# Patient Record
Sex: Female | Born: 1975 | Race: White | Hispanic: No | Smoking: Former smoker
Health system: Southern US, Community
[De-identification: ages and names within clinical notes are randomized; demographics above are authoritative.]

## PROBLEM LIST (undated history)

## (undated) DIAGNOSIS — J45909 Unspecified asthma, uncomplicated: Secondary | ICD-10-CM

## (undated) DIAGNOSIS — G43909 Migraine, unspecified, not intractable, without status migrainosus: Secondary | ICD-10-CM

## (undated) HISTORY — PX: WISDOM TOOTH EXTRACTION: SHX21

## (undated) HISTORY — PX: ARTHROSCOPY WITH ANTERIOR CRUCIATE LIGAMENT (ACL) REPAIR WITH ANTERIOR TIBILIAS GRAFT: SHX6503

## (undated) HISTORY — PX: TUBAL LIGATION: SHX77

## (undated) HISTORY — DX: Migraine, unspecified, not intractable, without status migrainosus: G43.909

## (undated) HISTORY — DX: Unspecified asthma, uncomplicated: J45.909

## (undated) HISTORY — PX: OTHER SURGICAL HISTORY: SHX169

---

## 1997-10-05 ENCOUNTER — Inpatient Hospital Stay (HOSPITAL_COMMUNITY): Admission: AD | Admit: 1997-10-05 | Discharge: 1997-10-08 | Payer: Self-pay | Admitting: *Deleted

## 1997-10-05 ENCOUNTER — Inpatient Hospital Stay (HOSPITAL_COMMUNITY): Admission: AD | Admit: 1997-10-05 | Discharge: 1997-10-05 | Payer: Self-pay | Admitting: Obstetrics & Gynecology

## 1997-10-10 ENCOUNTER — Inpatient Hospital Stay (HOSPITAL_COMMUNITY): Admission: AD | Admit: 1997-10-10 | Discharge: 1997-10-10 | Payer: Self-pay | Admitting: Obstetrics

## 1997-10-10 ENCOUNTER — Encounter: Admission: RE | Admit: 1997-10-10 | Discharge: 1998-01-08 | Payer: Self-pay | Admitting: *Deleted

## 1998-09-25 ENCOUNTER — Ambulatory Visit (HOSPITAL_COMMUNITY): Admission: RE | Admit: 1998-09-25 | Discharge: 1998-09-25 | Payer: Self-pay | Admitting: Obstetrics

## 1998-09-25 ENCOUNTER — Other Ambulatory Visit: Admission: RE | Admit: 1998-09-25 | Discharge: 1998-09-25 | Payer: Self-pay | Admitting: Obstetrics

## 1998-10-01 ENCOUNTER — Inpatient Hospital Stay (HOSPITAL_COMMUNITY): Admission: AD | Admit: 1998-10-01 | Discharge: 1998-10-01 | Payer: Self-pay | Admitting: Obstetrics

## 1998-10-11 ENCOUNTER — Inpatient Hospital Stay (HOSPITAL_COMMUNITY): Admission: AD | Admit: 1998-10-11 | Discharge: 1998-10-11 | Payer: Self-pay | Admitting: Obstetrics

## 1999-01-24 ENCOUNTER — Inpatient Hospital Stay (HOSPITAL_COMMUNITY): Admission: AD | Admit: 1999-01-24 | Discharge: 1999-01-24 | Payer: Self-pay | Admitting: Obstetrics

## 1999-01-27 ENCOUNTER — Other Ambulatory Visit: Admission: RE | Admit: 1999-01-27 | Discharge: 1999-01-27 | Payer: Self-pay | Admitting: Obstetrics

## 1999-01-29 ENCOUNTER — Inpatient Hospital Stay (HOSPITAL_COMMUNITY): Admission: AD | Admit: 1999-01-29 | Discharge: 1999-01-29 | Payer: Self-pay | Admitting: *Deleted

## 1999-02-13 ENCOUNTER — Inpatient Hospital Stay (HOSPITAL_COMMUNITY): Admission: AD | Admit: 1999-02-13 | Discharge: 1999-02-13 | Payer: Self-pay | Admitting: Obstetrics

## 1999-03-17 ENCOUNTER — Inpatient Hospital Stay (HOSPITAL_COMMUNITY): Admission: AD | Admit: 1999-03-17 | Discharge: 1999-03-19 | Payer: Self-pay | Admitting: Obstetrics

## 1999-04-14 ENCOUNTER — Inpatient Hospital Stay (HOSPITAL_COMMUNITY): Admission: AD | Admit: 1999-04-14 | Discharge: 1999-04-16 | Payer: Self-pay | Admitting: Obstetrics

## 1999-04-20 ENCOUNTER — Inpatient Hospital Stay (HOSPITAL_COMMUNITY): Admission: AD | Admit: 1999-04-20 | Discharge: 1999-04-20 | Payer: Self-pay | Admitting: Obstetrics

## 1999-06-10 ENCOUNTER — Ambulatory Visit (HOSPITAL_COMMUNITY): Admission: RE | Admit: 1999-06-10 | Discharge: 1999-06-10 | Payer: Self-pay | Admitting: Obstetrics

## 1999-06-16 ENCOUNTER — Inpatient Hospital Stay (HOSPITAL_COMMUNITY): Admission: AD | Admit: 1999-06-16 | Discharge: 1999-06-16 | Payer: Self-pay | Admitting: Obstetrics

## 2000-10-18 ENCOUNTER — Ambulatory Visit (HOSPITAL_BASED_OUTPATIENT_CLINIC_OR_DEPARTMENT_OTHER): Admission: RE | Admit: 2000-10-18 | Discharge: 2000-10-19 | Payer: Self-pay | Admitting: Orthopedic Surgery

## 2000-11-02 ENCOUNTER — Encounter: Admission: RE | Admit: 2000-11-02 | Discharge: 2001-01-04 | Payer: Self-pay | Admitting: Orthopedic Surgery

## 2001-07-03 ENCOUNTER — Emergency Department (HOSPITAL_COMMUNITY): Admission: EM | Admit: 2001-07-03 | Discharge: 2001-07-03 | Payer: Self-pay | Admitting: Emergency Medicine

## 2002-04-10 ENCOUNTER — Emergency Department (HOSPITAL_COMMUNITY): Admission: EM | Admit: 2002-04-10 | Discharge: 2002-04-11 | Payer: Self-pay | Admitting: Emergency Medicine

## 2002-04-10 ENCOUNTER — Encounter: Payer: Self-pay | Admitting: Emergency Medicine

## 2003-05-16 ENCOUNTER — Emergency Department (HOSPITAL_COMMUNITY): Admission: EM | Admit: 2003-05-16 | Discharge: 2003-05-16 | Payer: Self-pay | Admitting: Emergency Medicine

## 2003-05-24 ENCOUNTER — Ambulatory Visit (HOSPITAL_COMMUNITY): Admission: RE | Admit: 2003-05-24 | Discharge: 2003-05-24 | Payer: Self-pay | Admitting: Family Medicine

## 2003-05-24 ENCOUNTER — Encounter: Payer: Self-pay | Admitting: Family Medicine

## 2011-07-22 ENCOUNTER — Ambulatory Visit (HOSPITAL_COMMUNITY): Payer: Self-pay | Admitting: Psychology

## 2011-07-29 ENCOUNTER — Ambulatory Visit (HOSPITAL_COMMUNITY): Admitting: Psychology

## 2018-01-26 ENCOUNTER — Emergency Department (HOSPITAL_COMMUNITY): Payer: Self-pay

## 2018-01-26 ENCOUNTER — Encounter (HOSPITAL_COMMUNITY): Payer: Self-pay | Admitting: Emergency Medicine

## 2018-01-26 ENCOUNTER — Emergency Department (HOSPITAL_COMMUNITY)
Admission: EM | Admit: 2018-01-26 | Discharge: 2018-01-26 | Disposition: A | Discharge status: Home / Self Care | Payer: Self-pay | Attending: Emergency Medicine | Admitting: Emergency Medicine

## 2018-01-26 DIAGNOSIS — Y999 Unspecified external cause status: Secondary | ICD-10-CM | POA: Insufficient documentation

## 2018-01-26 DIAGNOSIS — F17210 Nicotine dependence, cigarettes, uncomplicated: Secondary | ICD-10-CM | POA: Insufficient documentation

## 2018-01-26 DIAGNOSIS — S8261XA Displaced fracture of lateral malleolus of right fibula, initial encounter for closed fracture: Secondary | ICD-10-CM | POA: Insufficient documentation

## 2018-01-26 DIAGNOSIS — Y929 Unspecified place or not applicable: Secondary | ICD-10-CM | POA: Insufficient documentation

## 2018-01-26 DIAGNOSIS — Y9301 Activity, walking, marching and hiking: Secondary | ICD-10-CM | POA: Insufficient documentation

## 2018-01-26 DIAGNOSIS — X501XXA Overexertion from prolonged static or awkward postures, initial encounter: Secondary | ICD-10-CM | POA: Insufficient documentation

## 2018-01-26 DIAGNOSIS — S8264XA Nondisplaced fracture of lateral malleolus of right fibula, initial encounter for closed fracture: Secondary | ICD-10-CM

## 2018-01-26 MED ORDER — HYDROCODONE-ACETAMINOPHEN 5-325 MG PO TABS: 1.0000 | ORAL_TABLET | Freq: Four times a day (QID) | ORAL | 0 refills | Status: DC | PRN

## 2018-01-26 NOTE — Discharge Instructions (Addendum)

## 2018-01-26 NOTE — ED Triage Notes (Signed)
Pt presents to ED for assessment of right ankle pain after a trip and slip where here ankle got twisted all the way to the left and then all the way to the right.

## 2018-01-26 NOTE — ED Provider Notes (Signed)
[facility]   CSN: [id number] Arrival date & time: 01/26/18  1844     History   Chief Complaint Chief Complaint  Patient presents with  . Ankle Pain    HPI Olivia James is a 42 y.o. female who presents today for evaluation of right ankle pain.  She reports that last night she was walking outside when she had a mechanical slip and her right ankle got twisted.  She reports that she has pain only in her right ankle.  Denies striking her head.  No blood thinner use.  No numbness tingling.  She has been taking Motrin at home with last dose at 3 PM with mild relief.  HPI  History reviewed. No pertinent past medical history.  There are no active problems to display for this patient.   Past Surgical History:  Procedure Laterality Date  . ARTHROSCOPY WITH ANTERIOR CRUCIATE LIGAMENT (ACL) REPAIR WITH ANTERIOR TIBILIAS GRAFT Right   . TUBAL LIGATION    . WISDOM TOOTH EXTRACTION       OB History   None      Home Medications    Prior to Admission medications   Medication Sig Start Date End Date Taking? Authorizing Provider  HYDROcodone-acetaminophen (NORCO/VICODIN) 5-325 MG tablet Take 1 tablet by mouth every 6 (six) hours as needed for severe pain. 01/26/18   Cristina GongHammond, Jowell Bossi W, PA-C    Family History History reviewed. No pertinent family history.  Social History Social History   Tobacco Use  . Smoking status: Current Every Day Smoker    Packs/day: 1.00  . Smokeless tobacco: Never Used  Substance Use Topics  . Alcohol use: Not Currently  . Drug use: Never     Allergies   Patient has no allergy information on record.   Review of Systems Review of Systems  Constitutional: Negative for chills and fever.  Musculoskeletal:       Right ankle pain.  No pain in right knee.      Physical Exam Updated Vital Signs BP 136/81   Pulse 71   Temp 98.1 F (36.7 C) (Oral)   Resp 18   LMP 01/23/2018    SpO2 100%   Physical Exam  Constitutional: She appears well-developed and well-nourished.  HENT:  Head: Normocephalic.  Cardiovascular:  Right 2+ DP/PT pulses.  Musculoskeletal:  No TTP over the Right proximal lower leg.  Right lateral ankle TTP.  Mild right ankle lateral swelling.   Neurological:  Sensation intact to right foot.  Skin: Skin is warm and dry. She is not diaphoretic.  No abnormal erythema, induration, or fluctuance over right ankle.  Nursing note and vitals reviewed.    ED Treatments / Results  Labs (all labs ordered are listed, but only abnormal results are displayed) Labs Reviewed - No data to display  EKG None  Radiology Dg Ankle Complete Right  Result Date: 01/26/2018 CLINICAL DATA:  Lateral right ankle pain after stepping off a step and twisting right ankle yesterday. EXAM: RIGHT ANKLE - COMPLETE 3+ VIEW COMPARISON:  None. FINDINGS: An acute closed transverse fracture of the lateral malleolus is noted extending into the ankle joint. No displacement or angulation. Trace joint effusion. There is no evidence of arthropathy or other focal bone abnormality. Mild soft tissue swelling is seen over the lateral malleolus. Base of fifth metatarsal appears intact. IMPRESSION: Acute, closed, nondisplaced transverse fracture of the lateral malleolus extending into the ankle joint. Trace joint effusion. Electronically Signed  By: Tollie Eth M.D.   On: 01/26/2018 19:54    Procedures Procedures (including critical care time)  Medications Ordered in ED Medications - No data to display   Initial Impression / Assessment and Plan / ED Course  I have reviewed the triage vital signs and the nursing notes.  Pertinent labs & imaging results that were available during my care of the patient were reviewed by me and considered in my medical decision making (see chart for details).  Clinical Course as of Jan 28 107  Wed Jan 26, 2018  2042 Spoke with Dr. Jena Gauss who viewed the  x-ray, requests splint and office follow-up not this week but next week.     [EH]    Clinical Course User Index [EH] Cristina Gong, PA-C   [name] Presents with right ankle pain after a mechanical fall consistent with an ankle sprain/strain.  The affected ankle has mild edema and is tender on the lateral aspect.  X-rays were obtained showing a lateral malleolus fracture. The skin is intact to ankle/foot.  The foot is warm and well perfused with intact sensation.  Motor function is limited secondary to pain.  Patient given instructions for OTC pain medication, splint, crutches, and Vicodin.  Patient advised to follow up with orthopedics.  Patient was given the option to ask questions, all of which were answered to the best of my ability.  Patient is agreeable for discharge.     Final Clinical Impressions(s) / ED Diagnoses   Final diagnoses:  Closed nondisplaced fracture of lateral malleolus of right fibula, initial encounter    ED Discharge Orders        Ordered    HYDROcodone-acetaminophen (NORCO/VICODIN) 5-325 MG tablet  Every 6 hours PRN     01/26/18 2142       Cristina Gong, PA-C 01/27/18 0108    Wynetta Fines, MD 01/27/18 1450

## 2018-01-26 NOTE — Progress Notes (Signed)
Orthopedic Tech Progress Note Patient Details:  Olivia James 28-Apr-1976 [id number]  Ortho Devices Type of Ortho Device: Ace wrap, Post (short leg) splint, Crutches Ortho Device/Splint Location: RLE Ortho Device/Splint Interventions: Ordered, Application, Adjustment   Post Interventions Patient Tolerated: Well Instructions Provided: Care of device   Jennye MoccasinHughes, [name] 01/26/2018, 9:34 PM

## 2019-10-11 IMAGING — CR DG ANKLE COMPLETE 3+V*R*
3 series · 3 of 3 positions shown · non-contrast
Comparison: None.

CLINICAL DATA: Lateral right ankle pain after stepping off a step
and twisting right ankle yesterday.

EXAM:
RIGHT ANKLE - COMPLETE 3+ VIEW

[ankle ap]
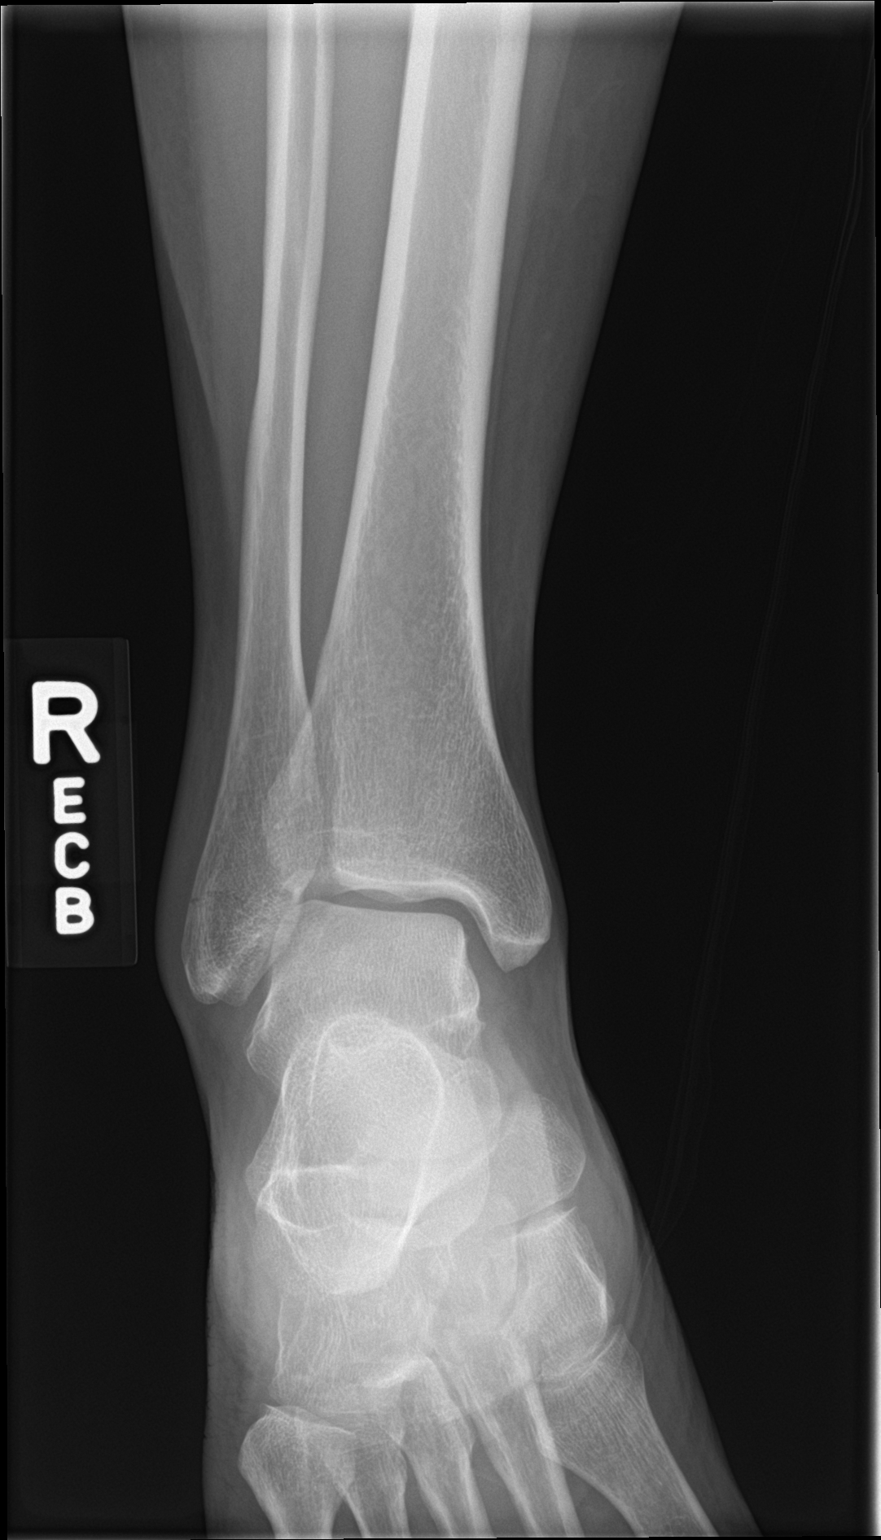

[ankle obl]
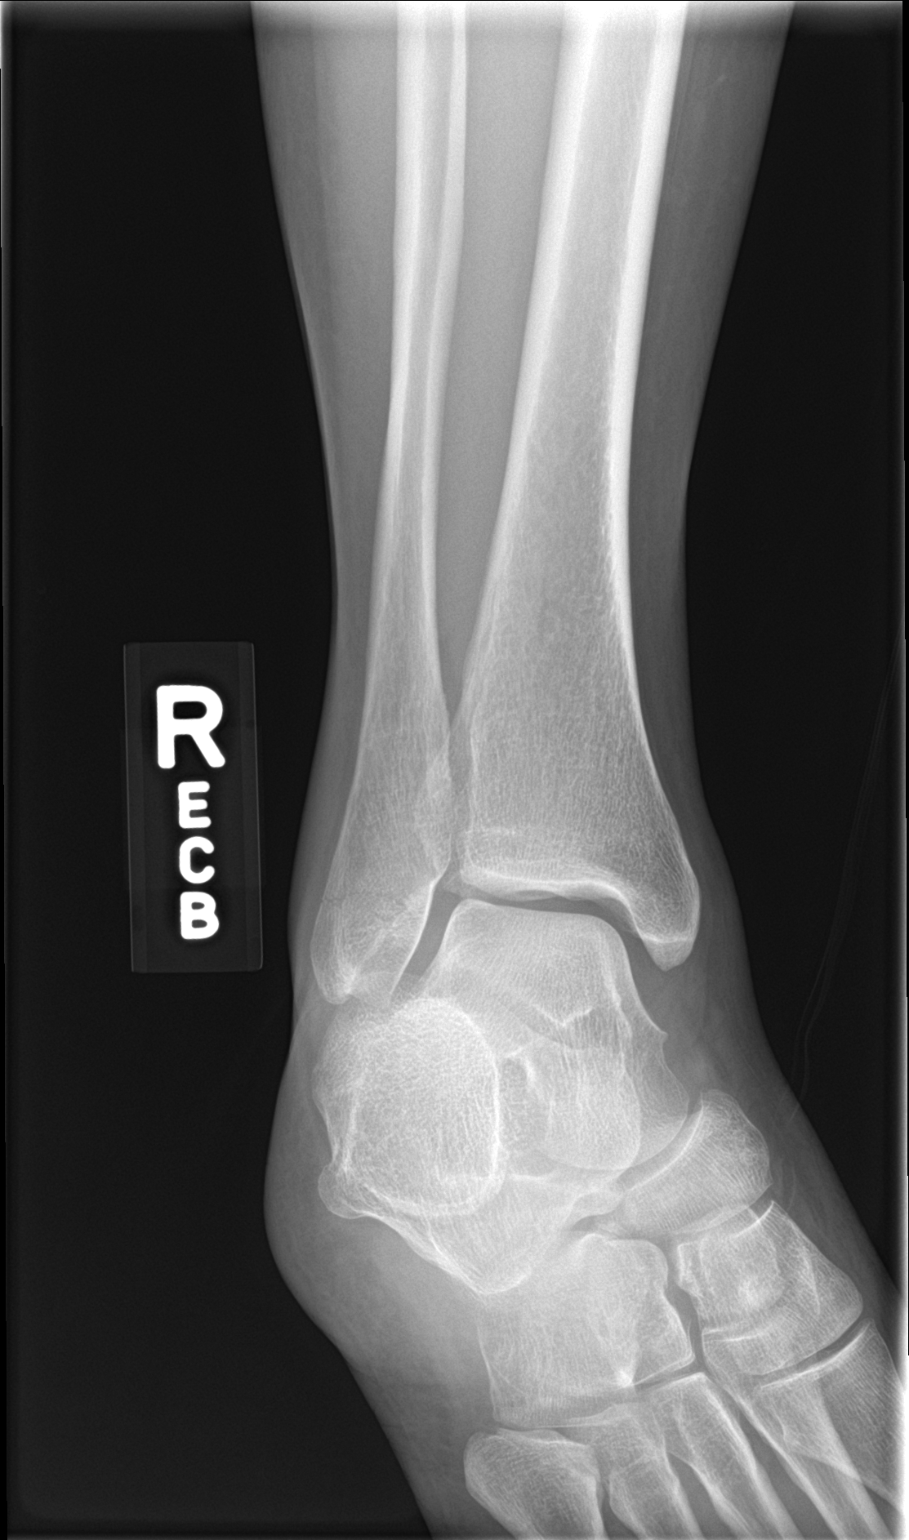

[ankle lat]
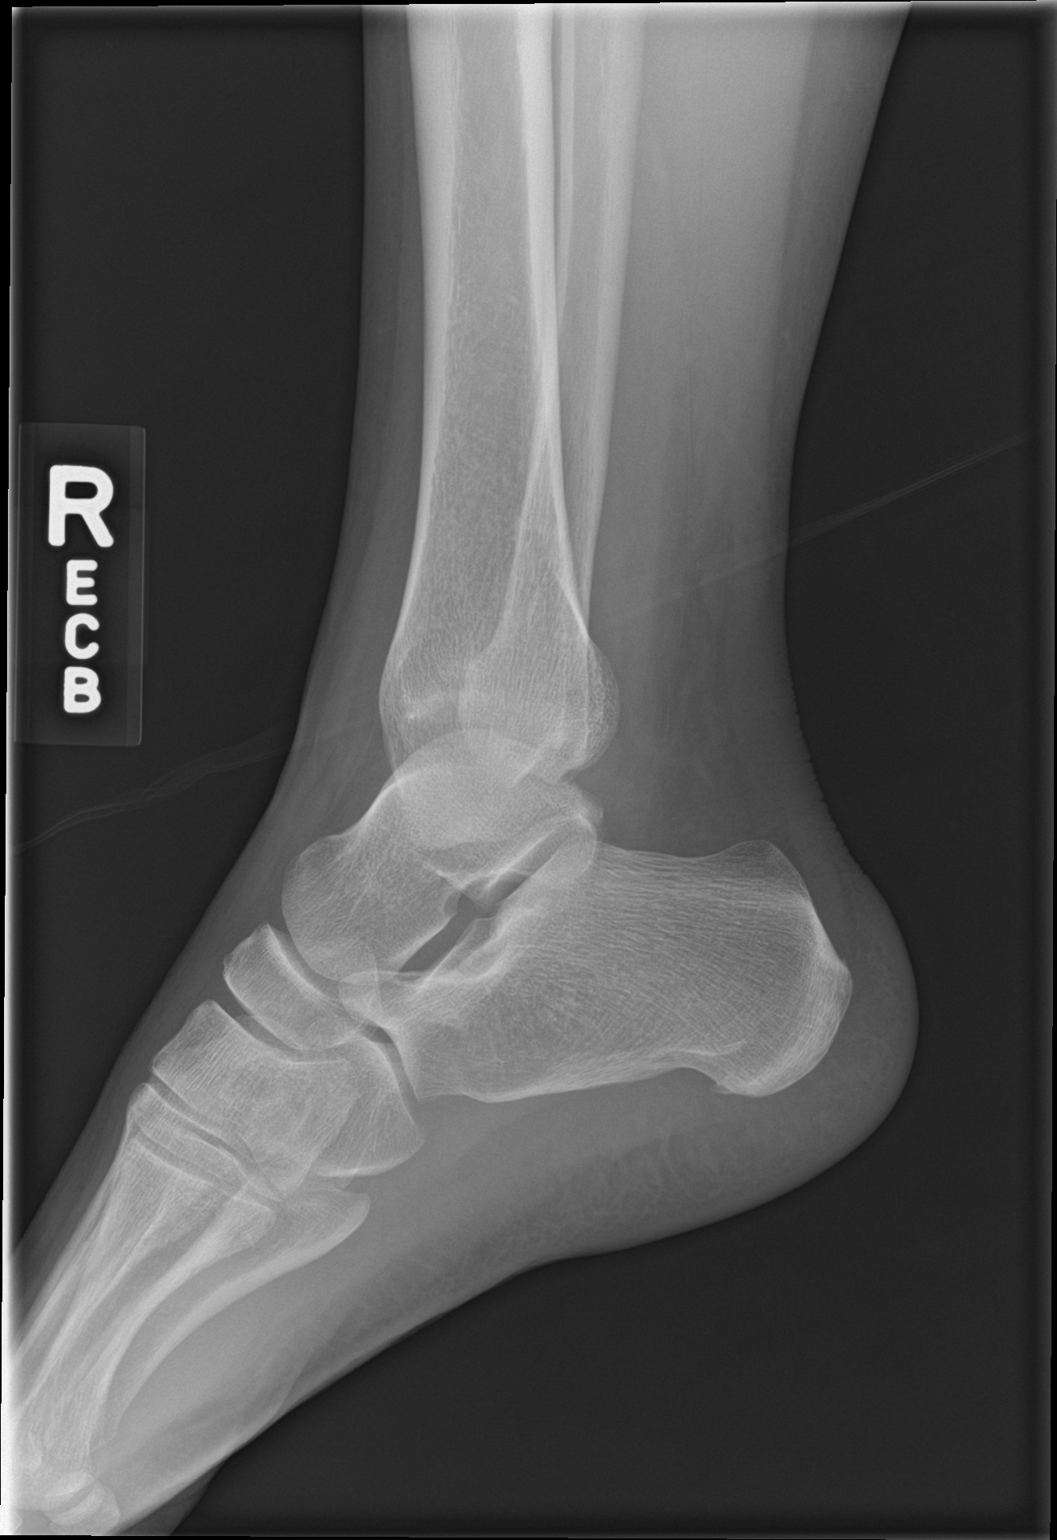

[3 of 3 positions shown; findings below may reference images not displayed]

FINDINGS: An acute closed transverse fracture of the lateral malleolus is
noted extending into the ankle joint. No displacement or angulation.
Trace joint effusion. There is no evidence of arthropathy or other
focal bone abnormality. Mild soft tissue swelling is seen over the
lateral malleolus. Base of fifth metatarsal appears intact.
IMPRESSION: Acute, closed, nondisplaced transverse fracture of the lateral
malleolus extending into the ankle joint. Trace joint effusion.

## 2022-08-31 ENCOUNTER — Encounter (HOSPITAL_COMMUNITY): Payer: Self-pay

## 2022-08-31 ENCOUNTER — Telehealth (HOSPITAL_COMMUNITY): Payer: Self-pay | Admitting: Mental Health

## 2022-08-31 ENCOUNTER — Ambulatory Visit (HOSPITAL_COMMUNITY): Payer: Medicaid Other | Admitting: Mental Health

## 2022-08-31 NOTE — Telephone Encounter (Signed)
Therapist sent link to connect to caregility for tele-assessment. No response after x 10 minutes. No answer. LM to reschedule appointment.

## 2022-12-17 ENCOUNTER — Encounter (HOSPITAL_COMMUNITY): Payer: Self-pay | Admitting: Emergency Medicine

## 2022-12-17 ENCOUNTER — Ambulatory Visit (HOSPITAL_COMMUNITY)
Admission: EM | Admit: 2022-12-17 | Discharge: 2022-12-17 | Disposition: A | Discharge status: Home / Self Care | Payer: Medicaid Other | Attending: Nurse Practitioner | Admitting: Nurse Practitioner

## 2022-12-17 DIAGNOSIS — R051 Acute cough: Secondary | ICD-10-CM | POA: Insufficient documentation

## 2022-12-17 DIAGNOSIS — Z20822 Contact with and (suspected) exposure to covid-19: Secondary | ICD-10-CM | POA: Insufficient documentation

## 2022-12-17 NOTE — Discharge Instructions (Addendum)
Your symptoms should improve over the next week to 10 days.  If you develop chest pain or shortness of breath, go to the emergency room.  We have tested you today for COVID-19.  You will see the results in Mychart and we will contact you with positive results.  Please stay home and isolate until you are aware of the results.    Some things that can make you feel better are: - Increased rest - Increasing fluid with water/sugar free electrolytes - Acetaminophen and ibuprofen as needed for fever/pain - Salt water gargling, chloraseptic spray and throat lozenges - OTC guaifenesin (Mucinex) 600 mg twice daily for congestion - Saline sinus flushes or a neti pot - Humidifying the air  Start allergy medication like Zyrtec and Flonase - nasal spray will help with the ear pain

## 2022-12-17 NOTE — ED Triage Notes (Signed)
Pt exposed to covid at work. Sent for testing. Denies s/s.

## 2022-12-17 NOTE — ED Provider Notes (Signed)
[facility]    CSN: [id number] Arrival date & time: 12/17/22  1150      History   Chief Complaint Chief Complaint  Patient presents with   Covid Exposure    HPI Olivia James is a 47 y.o. female.   Patient presents today for COVID-19 exposure at work.  Reports that she has had some allergy symptoms for which she has been taking DayQuil for the past few days.  She endorses hot flashes, congested cough, runny and stuffy nose, postnasal drainage, left ear pain without drainage.  No fever, body aches or chills, shortness of breath or chest pain, sore throat, headache, sinus pressure, abdominal pain, nausea/vomiting, or diarrhea.  Has not taken any allergy medications.    History reviewed. No pertinent past medical history.  There are no problems to display for this patient.   Past Surgical History:  Procedure Laterality Date   ARTHROSCOPY WITH ANTERIOR CRUCIATE LIGAMENT (ACL) REPAIR WITH ANTERIOR TIBILIAS GRAFT Right    c section     TUBAL LIGATION     WISDOM TOOTH EXTRACTION      OB History   No obstetric history on file.      Home Medications    Prior to Admission medications   Medication Sig Start Date End Date Taking? Authorizing Provider  HYDROcodone-acetaminophen (NORCO/VICODIN) 5-325 MG tablet Take 1 tablet by mouth every 6 (six) hours as needed for severe pain. 01/26/18   Cristina Gong, PA-C    Family History No family history on file.  Social History Social History   Tobacco Use   Smoking status: Every Day    Packs/day: 1    Types: Cigarettes   Smokeless tobacco: Never  Substance Use Topics   Alcohol use: Not Currently   Drug use: Never     Allergies   Patient has no known allergies.   Review of Systems Review of Systems Per HPI  Physical Exam Triage Vital Signs ED Triage Vitals  Enc Vitals Group     BP 12/17/22 1203 116/72     Pulse Rate 12/17/22 1203 88     Resp 12/17/22 1203 14     Temp 12/17/22 1203 98 F  (36.7 C)     Temp src --      SpO2 12/17/22 1203 97 %     Weight --      Height --      Head Circumference --      Peak Flow --      Pain Score 12/17/22 1202 0     Pain Loc --      Pain Edu? --      Excl. in GC? --    No data found.  Updated Vital Signs BP 116/72 (BP Location: Left Arm)   Pulse 88   Temp 98 F (36.7 C)   Resp 14   LMP 12/12/2022   SpO2 97%   Visual Acuity Right Eye Distance:   Left Eye Distance:   Bilateral Distance:    Right Eye Near:   Left Eye Near:    Bilateral Near:     Physical Exam Vitals and nursing note reviewed.  Constitutional:      General: She is not in acute distress.    Appearance: Normal appearance. She is not ill-appearing or toxic-appearing.  HENT:     Head: Normocephalic and atraumatic.     Right Ear: Ear canal and external ear normal. A middle ear effusion is present.  Left Ear: Tympanic membrane, ear canal and external ear normal.     Nose: No congestion or rhinorrhea.     Mouth/Throat:     Mouth: Mucous membranes are moist.     Pharynx: Oropharynx is clear. Posterior oropharyngeal erythema present. No oropharyngeal exudate.  Eyes:     General: No scleral icterus.    Extraocular Movements: Extraocular movements intact.  Cardiovascular:     Rate and Rhythm: Normal rate and regular rhythm.     Heart sounds: Normal heart sounds.  Pulmonary:     Effort: Pulmonary effort is normal. No respiratory distress.     Breath sounds: Normal breath sounds. No wheezing, rhonchi or rales.  Abdominal:     General: Abdomen is flat. Bowel sounds are normal. There is no distension.     Palpations: Abdomen is soft.     Tenderness: There is no abdominal tenderness. There is no guarding.  Musculoskeletal:     Cervical back: Normal range of motion and neck supple.  Lymphadenopathy:     Cervical: No cervical adenopathy.  Skin:    General: Skin is warm and dry.     Coloration: Skin is not jaundiced or pale.     Findings: No erythema or  rash.  Neurological:     Mental Status: She is alert and oriented to person, place, and time.  Psychiatric:        Behavior: Behavior is cooperative.      UC Treatments / Results  Labs (all labs ordered are listed, but only abnormal results are displayed) Labs Reviewed  SARS CORONAVIRUS 2 (TAT 6-24 HRS)    EKG   Radiology No results found.  Procedures Procedures (including critical care time)  Medications Ordered in UC Medications - No data to display  Initial Impression / Assessment and Plan / UC Course  I have reviewed the triage vital signs and the nursing notes.  Pertinent labs & imaging results that were available during my care of the patient were reviewed by me and considered in my medical decision making (see chart for details).   Patient is well-appearing, normotensive, afebrile, not tachycardic, not tachypneic, oxygenating well on room air.    1. Acute cough 2. Exposure to COVID-19 virus Suspect viral etiology, although symptoms could be consistent with allergies as well Vital signs and examination today are reassuring COVID-19 test is pending Recommended continued supportive care, can start oral antihistamine and Flonase nasal spray to see if this helps Note given for work ER and return precautions discussed  The patient was given the opportunity to ask questions.  All questions answered to their satisfaction.  The patient is in agreement to this plan.    Final Clinical Impressions(s) / UC Diagnoses   Final diagnoses:  Acute cough  Exposure to COVID-19 virus     Discharge Instructions      Your symptoms should improve over the next week to 10 days.  If you develop chest pain or shortness of breath, go to the emergency room.  We have tested you today for COVID-19.  You will see the results in Mychart and we will contact you with positive results.  Please stay home and isolate until you are aware of the results.    Some things that can make you  feel better are: - Increased rest - Increasing fluid with water/sugar free electrolytes - Acetaminophen and ibuprofen as needed for fever/pain - Salt water gargling, chloraseptic spray and throat lozenges - OTC guaifenesin (Mucinex) 600 mg twice  daily for congestion - Saline sinus flushes or a neti pot - Humidifying the air  Start allergy medication like Zyrtec and Flonase - nasal spray will help with the ear pain     ED Prescriptions   None    PDMP not reviewed this encounter.   Valentino Nose, NP 12/17/22 1253

## 2022-12-18 LAB — SARS CORONAVIRUS 2 (TAT 6-24 HRS): SARS Coronavirus 2: POSITIVE — AB

## 2023-01-05 ENCOUNTER — Ambulatory Visit (HOSPITAL_COMMUNITY): Payer: Medicaid Other

## 2023-04-01 ENCOUNTER — Ambulatory Visit (HOSPITAL_COMMUNITY)
Admission: EM | Admit: 2023-04-01 | Discharge: 2023-04-01 | Disposition: A | Discharge status: Home / Self Care | Payer: Medicaid Other | Attending: Family Medicine | Admitting: Family Medicine

## 2023-04-01 ENCOUNTER — Encounter (HOSPITAL_COMMUNITY): Payer: Self-pay | Admitting: *Deleted

## 2023-04-01 DIAGNOSIS — R634 Abnormal weight loss: Secondary | ICD-10-CM | POA: Insufficient documentation

## 2023-04-01 DIAGNOSIS — G43819 Other migraine, intractable, without status migrainosus: Secondary | ICD-10-CM | POA: Diagnosis present

## 2023-04-01 DIAGNOSIS — R000 Tachycardia, unspecified: Secondary | ICD-10-CM | POA: Diagnosis not present

## 2023-04-01 LAB — CBC
HCT: 30.7 % — ABNORMAL LOW (ref 36.0–46.0)
Hemoglobin: 8.7 g/dL — ABNORMAL LOW (ref 12.0–15.0)
MCH: 19.2 pg — ABNORMAL LOW (ref 26.0–34.0)
MCHC: 28.3 g/dL — ABNORMAL LOW (ref 30.0–36.0)
MCV: 67.8 fL — ABNORMAL LOW (ref 80.0–100.0)
Platelets: 236 10*3/uL (ref 150–400)
RBC: 4.53 MIL/uL (ref 3.87–5.11)
RDW: 18 % — ABNORMAL HIGH (ref 11.5–15.5)
WBC: 5.1 10*3/uL (ref 4.0–10.5)
nRBC: 0 % (ref 0.0–0.2)

## 2023-04-01 LAB — COMPREHENSIVE METABOLIC PANEL
ALT: 19 U/L (ref 0–44)
AST: 18 U/L (ref 15–41)
Albumin: 3.8 g/dL (ref 3.5–5.0)
Alkaline Phosphatase: 53 U/L (ref 38–126)
Anion gap: 8 (ref 5–15)
BUN: 7 mg/dL (ref 6–20)
CO2: 22 mmol/L (ref 22–32)
Calcium: 8.8 mg/dL — ABNORMAL LOW (ref 8.9–10.3)
Chloride: 107 mmol/L (ref 98–111)
Creatinine, Ser: 0.62 mg/dL (ref 0.44–1.00)
GFR, Estimated: >60 mL/min (ref >60)
Glucose, Bld: 93 mg/dL (ref 70–99)
Potassium: 3.4 mmol/L — ABNORMAL LOW (ref 3.5–5.1)
Sodium: 137 mmol/L (ref 135–145)
Total Bilirubin: 0.5 mg/dL (ref 0.3–1.2)
Total Protein: 6.7 g/dL (ref 6.5–8.1)

## 2023-04-01 LAB — TSH: TSH: 0.828 u[IU]/mL (ref 0.350–4.500)

## 2023-04-01 MED ORDER — DEXAMETHASONE SODIUM PHOSPHATE 10 MG/ML IJ SOLN
10.0000 mg | Freq: Once | INTRAMUSCULAR | Status: AC
  Administered 2023-04-01: 10 mg via INTRAMUSCULAR

## 2023-04-01 MED ORDER — KETOROLAC TROMETHAMINE 60 MG/2ML IM SOLN
60.0000 mg | Freq: Once | INTRAMUSCULAR | Status: AC
  Administered 2023-04-01: 60 mg via INTRAMUSCULAR

## 2023-04-01 MED ORDER — KETOROLAC TROMETHAMINE 60 MG/2ML IM SOLN
INTRAMUSCULAR | Status: AC
  Filled 2023-04-01: qty 2

## 2023-04-01 MED ORDER — DEXAMETHASONE SODIUM PHOSPHATE 10 MG/ML IJ SOLN
INTRAMUSCULAR | Status: AC
  Filled 2023-04-01: qty 1

## 2023-04-01 NOTE — ED Triage Notes (Signed)
Pt states that she would like her thyroid checked she states she had a heart rate of 114 at work the other day and she has lost some weight in the past couple of months.   Pt states he has a hx of migraines and she started with one yesterday and she is having some blurry vision. She hasn't taken any meds just tried to sleep it off. She didn't take meds today because she didn't want to dull it since she was coming to the doctor.

## 2023-04-01 NOTE — Discharge Instructions (Addendum)
You have had labs (blood tests) sent today. We will call you with any significant abnormalities or if there is need to begin or change treatment or pursue further follow up.  You may also review your test results online through MyChart. If you do not have a MyChart account, instructions to sign up should be on your discharge paperwork.  Meds ordered this encounter  Medications   ketorolac (TORADOL) injection 60 mg   dexamethasone (DECADRON) injection 10 mg

## 2023-04-06 NOTE — ED Provider Notes (Signed)
[facility]   [id number] 04/01/23 Arrival Time: 1647  ASSESSMENT & PLAN:  1. Other migraine without status migrainosus, intractable   2. Loss of weight   3. Tachycardia    Meds ordered this encounter  Medications   ketorolac (TORADOL) injection 60 mg   dexamethasone (DECADRON) injection 10 mg   Normal neurological exam. Afebrile without nuchal rigidity. Discussed. Current presentation and symptoms are consistent with prior migraines and are not consistent with SAH, ICH, meningitis, or temporal arteritis. No indication for neurodiagnostic workup at this time. Discussed.  Pt informed of lab abnormalities by RN.  Labs Reviewed  CBC - Abnormal; Notable for the following components:      Result Value   Hemoglobin 8.7 (*)    HCT 30.7 (*)    MCV 67.8 (*)    MCH 19.2 (*)    MCHC 28.3 (*)    RDW 18.0 (*)    All other components within normal limits  COMPREHENSIVE METABOLIC PANEL - Abnormal; Notable for the following components:   Potassium 3.4 (*)    Calcium 8.8 (*)    All other components within normal limits  TSH    Rec PCP f/u.   Reviewed expectations re: course of current medical issues. Questions answered. Outlined signs and symptoms indicating need for more acute intervention. Patient verbalized understanding. After Visit Summary given.   SUBJECTIVE: History from: Patient Patient is able to give a clear and coherent history.  Olivia James is a 47 y.o. female who presents with request that she would like her thyroid checked she states she had a heart rate of 114 at work the other day and she has lost some weight in the past couple of months.  Also with migraine headache. Typical for her; throbbing. Few days. Associated symptoms: Preceding aura: no. Nausea/vomiting: no. Vision changes: no. Increased sensitivity to light and to noises: yes. Fever: no. Sinus pressure/congestion: no. Extremity weakness: no. No tx PTA. Current headache has  limited normal daily activities. Denies loss of balance, muscle weakness, numbness of extremities, speech difficulties, and vision problems. No head injury reported. Ambulatory without difficulty. No recent travel.  ROS: As per HPI. All other systems negative.    OBJECTIVE:  Vitals:   04/01/23 1743  BP: 121/73  Pulse: 71  Resp: 18  Temp: 98.1 F (36.7 C)  TempSrc: Oral  SpO2: 99%    General appearance: alert; NAD HENT: normocephalic; atraumatic Eyes: PERRLA; EOMI; conjunctivae normal Neck: supple with FROM Lungs: clear to auscultation bilaterally; unlabored respirations Heart: regular rate and rhythm Extremities: no edema; symmetrical with no gross deformities Skin: warm and dry Neurologic: alert; speech is fluent and clear without dysarthria or aphasia; CN 2-12 grossly intact; no facial droop; normal gait; normal symmetric reflexes; normal extremity strength and sensation throughout; bilateral upper and lower extremity sensation is grossly intact with 5/5 symmetric strength; normal grip strength; normal plantar and dorsi flexion bilaterally; normal finger to nose bilaterally; negative pronator drift; negative Romberg sign Psychological: alert and cooperative; normal mood and affect  Labs: Results for orders placed or performed during the hospital encounter of 04/01/23  CBC  Result Value Ref Range   WBC 5.1 4.0 - 10.5 K/uL   RBC 4.53 3.87 - 5.11 MIL/uL   Hemoglobin 8.7 (L) 12.0 - 15.0 g/dL   HCT 57.8 (L) 46.9 - 62.9 %   MCV 67.8 (L) 80.0 - 100.0 fL   MCH 19.2 (L) 26.0 - 34.0 pg   MCHC 28.3 (L) 30.0 -  36.0 g/dL   RDW 40.1 (H) 02.7 - 25.3 %   Platelets 236 150 - 400 K/uL   nRBC 0.0 0.0 - 0.2 %  Comprehensive metabolic panel  Result Value Ref Range   Sodium 137 135 - 145 mmol/L   Potassium 3.4 (L) 3.5 - 5.1 mmol/L   Chloride 107 98 - 111 mmol/L   CO2 22 22 - 32 mmol/L   Glucose, Bld 93 70 - 99 mg/dL   BUN 7 6 - 20 mg/dL   Creatinine, Ser 6.64 0.44 - 1.00 mg/dL    Calcium 8.8 (L) 8.9 - 10.3 mg/dL   Total Protein 6.7 6.5 - 8.1 g/dL   Albumin 3.8 3.5 - 5.0 g/dL   AST 18 15 - 41 U/L   ALT 19 0 - 44 U/L   Alkaline Phosphatase 53 38 - 126 U/L   Total Bilirubin 0.5 0.3 - 1.2 mg/dL   GFR, Estimated >40 >34 mL/min   Anion gap 8 5 - 15  TSH  Result Value Ref Range   TSH 0.828 0.350 - 4.500 uIU/mL   Labs Reviewed  CBC - Abnormal; Notable for the following components:      Result Value   Hemoglobin 8.7 (*)    HCT 30.7 (*)    MCV 67.8 (*)    MCH 19.2 (*)    MCHC 28.3 (*)    RDW 18.0 (*)    All other components within normal limits  COMPREHENSIVE METABOLIC PANEL - Abnormal; Notable for the following components:   Potassium 3.4 (*)    Calcium 8.8 (*)    All other components within normal limits  TSH    Imaging: No results found.  No Known Allergies  History reviewed. No pertinent past medical history. Social History   Socioeconomic History   Marital status: Divorced    Spouse name: Not on file   Number of children: Not on file   Years of education: Not on file   Highest education level: Not on file  Occupational History   Not on file  Tobacco Use   Smoking status: Former    Current packs/day: 1.00    Types: Cigarettes   Smokeless tobacco: Never  Vaping Use   Vaping status: Every Day  Substance and Sexual Activity   Alcohol use: Not Currently   Drug use: Never   Sexual activity: Yes    Birth control/protection: Surgical  Other Topics Concern   Not on file  Social History Narrative   Not on file   Social Determinants of Health   Financial Resource Strain: Not on file  Food Insecurity: Not on file  Transportation Needs: Not on file  Physical Activity: Not on file  Stress: Not on file  Social Connections: Not on file  Intimate Partner Violence: Not on file   History reviewed. No pertinent family history. Past Surgical History:  Procedure Laterality Date   ARTHROSCOPY WITH ANTERIOR CRUCIATE LIGAMENT (ACL) REPAIR WITH  ANTERIOR TIBILIAS GRAFT Right    c section     TUBAL LIGATION     WISDOM TOOTH EXTRACTION        Mardella Layman, MD 04/06/23 1038

## 2023-07-01 ENCOUNTER — Encounter: Payer: Self-pay | Admitting: Obstetrics and Gynecology

## 2023-07-01 ENCOUNTER — Other Ambulatory Visit: Payer: Self-pay | Admitting: Obstetrics and Gynecology

## 2023-07-01 ENCOUNTER — Other Ambulatory Visit (HOSPITAL_COMMUNITY)
Admission: RE | Admit: 2023-07-01 | Discharge: 2023-07-01 | Disposition: A | Discharge status: Home / Self Care | Payer: Medicaid Other | Source: Ambulatory Visit | Attending: Obstetrics and Gynecology | Admitting: Obstetrics and Gynecology

## 2023-07-01 ENCOUNTER — Ambulatory Visit: Payer: Medicaid Other | Admitting: Obstetrics and Gynecology

## 2023-07-01 VITALS — BP 118/70 | HR 82 | Ht 67.0 in | Wt 131.0 lb

## 2023-07-01 DIAGNOSIS — N920 Excessive and frequent menstruation with regular cycle: Secondary | ICD-10-CM | POA: Diagnosis not present

## 2023-07-01 DIAGNOSIS — Z124 Encounter for screening for malignant neoplasm of cervix: Secondary | ICD-10-CM

## 2023-07-01 DIAGNOSIS — Z01411 Encounter for gynecological examination (general) (routine) with abnormal findings: Secondary | ICD-10-CM | POA: Diagnosis not present

## 2023-07-01 DIAGNOSIS — Z9483 Pancreas transplant status: Secondary | ICD-10-CM

## 2023-07-01 DIAGNOSIS — N6315 Unspecified lump in the right breast, overlapping quadrants: Secondary | ICD-10-CM

## 2023-07-01 DIAGNOSIS — Z1339 Encounter for screening examination for other mental health and behavioral disorders: Secondary | ICD-10-CM | POA: Diagnosis not present

## 2023-07-01 DIAGNOSIS — R300 Dysuria: Secondary | ICD-10-CM

## 2023-07-01 DIAGNOSIS — Z01419 Encounter for gynecological examination (general) (routine) without abnormal findings: Secondary | ICD-10-CM | POA: Diagnosis present

## 2023-07-01 DIAGNOSIS — N6489 Other specified disorders of breast: Secondary | ICD-10-CM

## 2023-07-01 LAB — POCT URINALYSIS DIPSTICK
Glucose, UA: NEGATIVE
Ketones, UA: NEGATIVE
Nitrite, UA: NEGATIVE
Protein, UA: POSITIVE — AB
Spec Grav, UA: >=1.03 — AB (ref 1.010–1.025)
Urobilinogen, UA: 0.2 U/dL
pH, UA: 6 (ref 5.0–8.0)

## 2023-07-01 MED ORDER — TRANEXAMIC ACID 650 MG PO TABS: 1300.0000 mg | ORAL_TABLET | Freq: Three times a day (TID) | ORAL | 2 refills | Status: DC

## 2023-07-01 NOTE — Addendum Note (Signed)
Addended by: Jearld Adjutant on: 07/01/2023 04:36 PM   Modules accepted: Orders

## 2023-07-01 NOTE — Progress Notes (Signed)
ANNUAL EXAM Patient name: Olivia James MRN [id number]  Date of birth: [birth year] Chief Complaint:   Establish Care  History of Present Illness:   Olivia James is a 47 y.o. G2P0  female being seen today for a routine annual exam.  Current complaints: history of migraines, anemia, was seeing fam med. Has had a BTL, has a history of heavy periods, tried COC and depo provera and did not tolerate side effects. Period for 7 days, having to use super plus pads, sometimes changing more than one an hour. Does not desire birth control.No bleeding outside of cycle   Patient's last menstrual period was 06/09/2023 (exact date).   The pregnancy intention screening data noted above was reviewed. Potential methods of contraception were discussed. The patient elected to proceed with BTL  Last pap thinks around 15 years ago. Results were:  reports it was normal . H/O abnormal pap: no Last mammogram: n/a. Results were: N/A. Family h/o breast cancer: no      07/01/2023    8:35 AM  Depression screen PHQ 2/9  Decreased Interest 0  Down, Depressed, Hopeless 1  PHQ - 2 Score 1  Altered sleeping 0  Tired, decreased energy 2  Change in appetite 2  Feeling bad or failure about yourself  1  Trouble concentrating 0  Moving slowly or fidgety/restless 0  Suicidal thoughts 0  PHQ-9 Score 6        07/01/2023    8:36 AM  GAD 7 : Generalized Anxiety Score  Nervous, Anxious, on Edge 2  Control/stop worrying 2  Worry too much - different things 2  Trouble relaxing 1  Restless 0  Easily annoyed or irritable 2  Afraid - awful might happen 2  Total GAD 7 Score 11     Review of Systems:   Pertinent items are noted in HPI Denies any headaches, blurred vision, fatigue, shortness of breath, chest pain, abdominal pain, abnormal vaginal discharge/itching/odor/irritation, problems with periods, bowel movements, urination, or intercourse unless otherwise stated above. Pertinent History  Reviewed:  Reviewed past medical,surgical, social and family history.  Reviewed problem list, medications and allergies. Physical Assessment:   Vitals:   07/01/23 0817  BP: 118/70  Pulse: 82  Weight: 131 lb (59.4 kg)  Height: 5\' 7"  (1.702 m)  Body mass index is 20.52 kg/m.        Physical Examination:   General appearance - well appearing, and in no distress  Mental status - alert, oriented   Psych:  She has a normal mood and affect  Skin - warm and dry  Chest - effort normal  Heart - normal rate and regular rhythm  Neck:  midline trachea  Breasts - left breast normal no nodule, right breast lump located around 12 oclock, non tender and mobile. no skin or nipple changes or  axillary nodes  Abdomen - soft, nontender, nondistended, no masses or organomegaly  Pelvic - VULVA: normal appearing vulva with no masses, tenderness or lesions  VAGINA: normal appearing vagina with normal color and discharge, no lesions  CERVIX: normal appearing cervix without discharge or lesions, no CMT  Thin prep pap is done w HR HPV cotesting  UTERUS: uterus is felt to be normal size, shape, consistency and nontender   ADNEXA: No adnexal masses or tenderness noted.    Extremities:  No swelling or varicosities noted  Chaperone present for exam  No results found for this or any previous visit (from the past 24 hour(s)).  Assessment &  Plan:  1. Well woman exam with routine gynecological exam 2. Cervical cancer screening - Cytology - PAP( [place]) - Cervicovaginal ancillary only( [address])   3. Menorrhagia with regular cycle Discussed trial of lysteda and nsaids during cycle. Does not desire birth control  Will follow up in three months to assess.   - tranexamic acid (LYSTEDA) 650 MG TABS tablet; Take 2 tablets (1,300 mg total) by mouth 3 (three) times daily. Take during menses for a maximum of five days  Dispense: 30 tablet; Refill: 2  4. Breast lump on right side at 12 o'clock position Had  not been scheduled for mammogram yet, discussed mobile lump on breast, could be fibrocystic would like to get in sooner, will schedule for diagnostic  - MM 3D DIAGNOSTIC MAMMOGRAM BILATERAL BREAST; Future     Labs/procedures today:   Mammogram: schedule screening mammo as soon as possible, or sooner if problems   Orders Placed This Encounter  Procedures   MM 3D DIAGNOSTIC MAMMOGRAM BILATERAL BREAST    Meds:  Meds ordered this encounter  Medications   tranexamic acid (LYSTEDA) 650 MG TABS tablet    Sig: Take 2 tablets (1,300 mg total) by mouth 3 (three) times daily. Take during menses for a maximum of five days    Dispense:  30 tablet    Refill:  2    Follow-up: Return in about 3 months (around 10/01/2023), or follow up. Future Appointments  Date Time Provider Department Center  07/28/2023  8:10 AM [facility] [place] [facility]  07/28/2023  8:20 AM [facility] [place] [facility]     Albertine Grates, FNP

## 2023-07-02 LAB — CERVICOVAGINAL ANCILLARY ONLY
Bacterial Vaginitis (gardnerella): POSITIVE — AB
Candida Glabrata: NEGATIVE
Candida Vaginitis: NEGATIVE
Chlamydia: NEGATIVE
Comment: NEGATIVE
Comment: NEGATIVE
Comment: NEGATIVE
Comment: NEGATIVE
Comment: NEGATIVE
Comment: NORMAL
Neisseria Gonorrhea: NEGATIVE
Trichomonas: NEGATIVE

## 2023-07-06 LAB — CYTOLOGY - PAP
Comment: NEGATIVE
Diagnosis: NEGATIVE
High risk HPV: NEGATIVE

## 2023-07-06 MED ORDER — METRONIDAZOLE 500 MG PO TABS: 500.0000 mg | ORAL_TABLET | Freq: Two times a day (BID) | ORAL | 0 refills | Status: AC

## 2023-07-06 NOTE — Addendum Note (Signed)
Addended by: Sue Lush on: 07/06/2023 03:38 PM   Modules accepted: Orders

## 2023-07-28 ENCOUNTER — Ambulatory Visit
Admission: RE | Admit: 2023-07-28 | Discharge: 2023-07-28 | Disposition: A | Discharge status: Home / Self Care | Payer: Medicaid Other | Source: Ambulatory Visit | Attending: Obstetrics and Gynecology | Admitting: Obstetrics and Gynecology

## 2023-07-28 ENCOUNTER — Other Ambulatory Visit: Payer: Self-pay | Admitting: Obstetrics and Gynecology

## 2023-07-28 DIAGNOSIS — N6310 Unspecified lump in the right breast, unspecified quadrant: Secondary | ICD-10-CM

## 2023-07-28 DIAGNOSIS — N6489 Other specified disorders of breast: Secondary | ICD-10-CM

## 2023-07-28 DIAGNOSIS — N6315 Unspecified lump in the right breast, overlapping quadrants: Secondary | ICD-10-CM

## 2023-10-25 ENCOUNTER — Other Ambulatory Visit: Payer: Self-pay | Admitting: Obstetrics and Gynecology

## 2023-11-06 ENCOUNTER — Emergency Department (HOSPITAL_COMMUNITY)

## 2023-11-06 ENCOUNTER — Encounter (HOSPITAL_COMMUNITY): Payer: Self-pay | Admitting: Emergency Medicine

## 2023-11-06 ENCOUNTER — Ambulatory Visit (HOSPITAL_COMMUNITY): Admission: EM | Admit: 2023-11-06 | Discharge: 2023-11-06 | Disposition: A | Discharge status: Home / Self Care

## 2023-11-06 ENCOUNTER — Other Ambulatory Visit: Payer: Self-pay

## 2023-11-06 ENCOUNTER — Observation Stay (HOSPITAL_COMMUNITY)

## 2023-11-06 ENCOUNTER — Inpatient Hospital Stay (HOSPITAL_COMMUNITY)
Admission: EM | Admit: 2023-11-06 | Discharge: 2023-11-10 | DRG: 418 | Disposition: A | Discharge status: Home / Self Care | Attending: Internal Medicine | Admitting: Internal Medicine

## 2023-11-06 DIAGNOSIS — R001 Bradycardia, unspecified: Secondary | ICD-10-CM

## 2023-11-06 DIAGNOSIS — E861 Hypovolemia: Secondary | ICD-10-CM

## 2023-11-06 DIAGNOSIS — F17290 Nicotine dependence, other tobacco product, uncomplicated: Secondary | ICD-10-CM | POA: Diagnosis present

## 2023-11-06 DIAGNOSIS — R1011 Right upper quadrant pain: Secondary | ICD-10-CM | POA: Diagnosis not present

## 2023-11-06 DIAGNOSIS — R7401 Elevation of levels of liver transaminase levels: Secondary | ICD-10-CM | POA: Diagnosis present

## 2023-11-06 DIAGNOSIS — K8020 Calculus of gallbladder without cholecystitis without obstruction: Secondary | ICD-10-CM | POA: Diagnosis not present

## 2023-11-06 DIAGNOSIS — Z9851 Tubal ligation status: Secondary | ICD-10-CM

## 2023-11-06 DIAGNOSIS — B9620 Unspecified Escherichia coli [E. coli] as the cause of diseases classified elsewhere: Secondary | ICD-10-CM | POA: Diagnosis present

## 2023-11-06 DIAGNOSIS — K8062 Calculus of gallbladder and bile duct with acute cholecystitis without obstruction: Principal | ICD-10-CM | POA: Diagnosis present

## 2023-11-06 DIAGNOSIS — K851 Biliary acute pancreatitis without necrosis or infection: Secondary | ICD-10-CM | POA: Diagnosis present

## 2023-11-06 DIAGNOSIS — N390 Urinary tract infection, site not specified: Secondary | ICD-10-CM | POA: Diagnosis present

## 2023-11-06 DIAGNOSIS — K8080 Other cholelithiasis without obstruction: Principal | ICD-10-CM

## 2023-11-06 DIAGNOSIS — K828 Other specified diseases of gallbladder: Secondary | ICD-10-CM | POA: Diagnosis present

## 2023-11-06 DIAGNOSIS — Z604 Social exclusion and rejection: Secondary | ICD-10-CM | POA: Diagnosis present

## 2023-11-06 DIAGNOSIS — F17210 Nicotine dependence, cigarettes, uncomplicated: Secondary | ICD-10-CM | POA: Diagnosis present

## 2023-11-06 DIAGNOSIS — Z98891 History of uterine scar from previous surgery: Secondary | ICD-10-CM

## 2023-11-06 DIAGNOSIS — D696 Thrombocytopenia, unspecified: Secondary | ICD-10-CM | POA: Diagnosis present

## 2023-11-06 DIAGNOSIS — Z91040 Latex allergy status: Secondary | ICD-10-CM

## 2023-11-06 DIAGNOSIS — Z79899 Other long term (current) drug therapy: Secondary | ICD-10-CM

## 2023-11-06 DIAGNOSIS — G43909 Migraine, unspecified, not intractable, without status migrainosus: Secondary | ICD-10-CM | POA: Diagnosis present

## 2023-11-06 DIAGNOSIS — G43901 Migraine, unspecified, not intractable, with status migrainosus: Secondary | ICD-10-CM | POA: Diagnosis present

## 2023-11-06 DIAGNOSIS — Z9889 Other specified postprocedural states: Secondary | ICD-10-CM

## 2023-11-06 DIAGNOSIS — K8050 Calculus of bile duct without cholangitis or cholecystitis without obstruction: Secondary | ICD-10-CM | POA: Diagnosis present

## 2023-11-06 DIAGNOSIS — E876 Hypokalemia: Secondary | ICD-10-CM | POA: Diagnosis not present

## 2023-11-06 LAB — COMPREHENSIVE METABOLIC PANEL
ALT: 1208 U/L — ABNORMAL HIGH (ref 0–44)
AST: 1318 U/L — ABNORMAL HIGH (ref 15–41)
Albumin: 4.5 g/dL (ref 3.5–5.0)
Alkaline Phosphatase: 96 U/L (ref 38–126)
Anion gap: 7 (ref 5–15)
BUN: 14 mg/dL (ref 6–20)
CO2: 22 mmol/L (ref 22–32)
Calcium: 9.6 mg/dL (ref 8.9–10.3)
Chloride: 106 mmol/L (ref 98–111)
Creatinine, Ser: 0.51 mg/dL (ref 0.44–1.00)
GFR, Estimated: >60 mL/min (ref >60)
Glucose, Bld: 97 mg/dL (ref 70–99)
Potassium: 3.7 mmol/L (ref 3.5–5.1)
Sodium: 135 mmol/L (ref 135–145)
Total Bilirubin: 4.1 mg/dL — ABNORMAL HIGH (ref 0.0–1.2)
Total Protein: 7.4 g/dL (ref 6.5–8.1)

## 2023-11-06 LAB — HEPATITIS PANEL, ACUTE
HCV Ab: NONREACTIVE
Hep A IgM: NONREACTIVE
Hep B C IgM: NONREACTIVE
Hepatitis B Surface Ag: NONREACTIVE

## 2023-11-06 LAB — URINALYSIS, ROUTINE W REFLEX MICROSCOPIC
Bilirubin Urine: NEGATIVE
Glucose, UA: NEGATIVE mg/dL
Hgb urine dipstick: NEGATIVE
Ketones, ur: NEGATIVE mg/dL
Leukocytes,Ua: NEGATIVE
Nitrite: POSITIVE — AB
Protein, ur: NEGATIVE mg/dL
Specific Gravity, Urine: 1.013 (ref 1.005–1.030)
pH: 6 (ref 5.0–8.0)

## 2023-11-06 LAB — CBC
HCT: 42.7 % (ref 36.0–46.0)
Hemoglobin: 14.5 g/dL (ref 12.0–15.0)
MCH: 30.4 pg (ref 26.0–34.0)
MCHC: 34 g/dL (ref 30.0–36.0)
MCV: 89.5 fL (ref 80.0–100.0)
Platelets: 166 10*3/uL (ref 150–400)
RBC: 4.77 MIL/uL (ref 3.87–5.11)
RDW: 13 % (ref 11.5–15.5)
WBC: 4.4 10*3/uL (ref 4.0–10.5)
nRBC: 0 % (ref 0.0–0.2)

## 2023-11-06 LAB — TSH: TSH: 0.904 u[IU]/mL (ref 0.350–4.500)

## 2023-11-06 LAB — LACTIC ACID, PLASMA
Lactic Acid, Venous: 0.7 mmol/L (ref 0.5–1.9)
Lactic Acid, Venous: 1 mmol/L (ref 0.5–1.9)

## 2023-11-06 LAB — PROTIME-INR
INR: 1.2 (ref 0.8–1.2)
Prothrombin Time: 15.2 s (ref 11.4–15.2)

## 2023-11-06 LAB — LIPASE, BLOOD: Lipase: 31 U/L (ref 11–51)

## 2023-11-06 LAB — HCG, SERUM, QUALITATIVE: Preg, Serum: NEGATIVE

## 2023-11-06 MED ORDER — ACETAMINOPHEN 325 MG PO TABS: 650.0000 mg | ORAL_TABLET | Freq: Four times a day (QID) | ORAL | Status: DC | PRN

## 2023-11-06 MED ORDER — HYDROMORPHONE HCL 1 MG/ML IJ SOLN
0.5000 mg | INTRAMUSCULAR | Status: DC | PRN
  Administered 2023-11-07 (×2): 0.5 mg via INTRAVENOUS
  Filled 2023-11-06 (×2): qty 0.5

## 2023-11-06 MED ORDER — ENOXAPARIN SODIUM 40 MG/0.4ML IJ SOSY
40.0000 mg | PREFILLED_SYRINGE | INTRAMUSCULAR | Status: DC
  Administered 2023-11-06 – 2023-11-09 (×4): 40 mg via SUBCUTANEOUS
  Filled 2023-11-06 (×4): qty 0.4

## 2023-11-06 MED ORDER — OXYCODONE HCL 5 MG PO TABS
5.0000 mg | ORAL_TABLET | ORAL | Status: DC | PRN
  Administered 2023-11-07 – 2023-11-09 (×2): 5 mg via ORAL
  Filled 2023-11-06 (×2): qty 1

## 2023-11-06 MED ORDER — ENOXAPARIN SODIUM 40 MG/0.4ML IJ SOSY
40.0000 mg | PREFILLED_SYRINGE | INTRAMUSCULAR | Status: DC
Start: 2023-11-07 — End: 2023-11-06

## 2023-11-06 MED ORDER — ONDANSETRON HCL 4 MG/2ML IJ SOLN
4.0000 mg | Freq: Once | INTRAMUSCULAR | Status: AC
  Administered 2023-11-06: 4 mg via INTRAVENOUS
  Filled 2023-11-06: qty 2

## 2023-11-06 MED ORDER — SODIUM CHLORIDE 0.9 % IV BOLUS
1000.0000 mL | Freq: Once | INTRAVENOUS | Status: AC
  Administered 2023-11-06: 1000 mL via INTRAVENOUS

## 2023-11-06 MED ORDER — ACETAMINOPHEN 650 MG RE SUPP: 650.0000 mg | Freq: Four times a day (QID) | RECTAL | Status: DC | PRN

## 2023-11-06 MED ORDER — GADOBUTROL 1 MMOL/ML IV SOLN
6.0000 mL | Freq: Once | INTRAVENOUS | Status: AC | PRN
  Administered 2023-11-06: 6 mL via INTRAVENOUS

## 2023-11-06 MED ORDER — ENOXAPARIN SODIUM 40 MG/0.4ML IJ SOSY: 40.0000 mg | PREFILLED_SYRINGE | INTRAMUSCULAR | Status: DC

## 2023-11-06 MED ORDER — ONDANSETRON HCL 4 MG PO TABS: 4.0000 mg | ORAL_TABLET | Freq: Four times a day (QID) | ORAL | Status: DC | PRN

## 2023-11-06 MED ORDER — LACTATED RINGERS IV BOLUS
1000.0000 mL | Freq: Once | INTRAVENOUS | Status: AC
  Administered 2023-11-06: 1000 mL via INTRAVENOUS

## 2023-11-06 MED ORDER — MORPHINE SULFATE (PF) 4 MG/ML IV SOLN
4.0000 mg | Freq: Once | INTRAVENOUS | Status: DC
  Filled 2023-11-06: qty 1

## 2023-11-06 MED ORDER — HYDROMORPHONE HCL 1 MG/ML IJ SOLN
0.5000 mg | Freq: Once | INTRAMUSCULAR | Status: AC
  Administered 2023-11-06: 0.5 mg via INTRAVENOUS
  Filled 2023-11-06: qty 1

## 2023-11-06 MED ORDER — LORAZEPAM 1 MG PO TABS
1.0000 mg | ORAL_TABLET | ORAL | Status: DC | PRN
  Administered 2023-11-06: 1 mg via ORAL
  Filled 2023-11-06: qty 1

## 2023-11-06 MED ORDER — LACTATED RINGERS IV SOLN: INTRAVENOUS | Status: AC

## 2023-11-06 MED ORDER — ONDANSETRON HCL 4 MG/2ML IJ SOLN
4.0000 mg | Freq: Four times a day (QID) | INTRAMUSCULAR | Status: DC | PRN
  Administered 2023-11-07 – 2023-11-09 (×2): 4 mg via INTRAVENOUS
  Filled 2023-11-06: qty 2

## 2023-11-06 NOTE — ED Notes (Signed)
 Attempted to call report to pt's nurse on [address], nurse in another pt's room at this time. Secure chat sent with update on pt.

## 2023-11-06 NOTE — ED Provider Notes (Signed)
 [facility]    CSN: [id number] Arrival date & time: 11/06/23  1008      History   Chief Complaint Chief Complaint  Patient presents with   Abdominal Pain    HPI Olivia James is a 48 y.o. female.  Yesterday developed right upper quadrant abdominal pain that radiated around to her back.  Last night had chills, sweats, several episodes of vomiting.  This morning still some nausea but no further vomiting.  She feels there is a "rope around her abdomen" squeezing her.  Pain rated 7/10 at rest. No diarrhea  History reviewed. No pertinent past medical history.  There are no active problems to display for this patient.   Past Surgical History:  Procedure Laterality Date   ARTHROSCOPY WITH ANTERIOR CRUCIATE LIGAMENT (ACL) REPAIR WITH ANTERIOR TIBILIAS GRAFT Right    c section     TUBAL LIGATION     WISDOM TOOTH EXTRACTION      OB History     Gravida  2   Para      Term      Preterm      AB      Living  2      SAB      IAB      Ectopic      Multiple      Live Births               Home Medications    Prior to Admission medications   Medication Sig Start Date End Date Taking? Authorizing Provider  tranexamic acid (LYSTEDA) 650 MG TABS tablet Take 2 tablets (1,300 mg total) by mouth 3 (three) times daily. Take during menses for a maximum of five days 07/01/23   Sue Lush, FNP    Family History History reviewed. No pertinent family history.  Social History Social History   Tobacco Use   Smoking status: Former    Current packs/day: 1.00    Types: Cigarettes   Smokeless tobacco: Never  Vaping Use   Vaping status: Every Day  Substance Use Topics   Alcohol use: Not Currently   Drug use: Never     Allergies   Patient has no known allergies.   Review of Systems Review of Systems  Gastrointestinal:  Positive for abdominal pain.   Per HPI  Physical Exam Triage Vital Signs ED Triage Vitals [11/06/23 1031]   Encounter Vitals Group     BP 120/80     Systolic BP Percentile      Diastolic BP Percentile      Pulse Rate (!) 57     Resp 18     Temp 97.9 F (36.6 C)     Temp Source Oral     SpO2 98 %     Weight      Height      Head Circumference      Peak Flow      Pain Score 8     Pain Loc      Pain Education      Exclude from Growth Chart    No data found.  Updated Vital Signs BP 120/80 (BP Location: Right Arm)   Pulse (!) 57   Temp 97.9 F (36.6 C) (Oral)   Resp 18   LMP 10/14/2023 (Approximate)   SpO2 98%   Visual Acuity Right Eye Distance:   Left Eye Distance:   Bilateral Distance:    Right Eye Near:   Left Eye Near:  Bilateral Near:     Physical Exam Vitals and nursing note reviewed.  Constitutional:      Appearance: Normal appearance. She is not diaphoretic.     Comments: Uncomfortable, curled up on table  HENT:     Mouth/Throat:     Mouth: Mucous membranes are moist.     Pharynx: Oropharynx is clear.  Eyes:     Conjunctiva/sclera: Conjunctivae normal.  Cardiovascular:     Rate and Rhythm: Normal rate and regular rhythm.     Heart sounds: Normal heart sounds.  Pulmonary:     Effort: Pulmonary effort is normal. No respiratory distress.     Breath sounds: Normal breath sounds.  Abdominal:     General: Bowel sounds are normal.     Palpations: Abdomen is soft.     Tenderness: There is abdominal tenderness in the right upper quadrant and epigastric area. There is no right CVA tenderness, left CVA tenderness, guarding or rebound. Positive signs include Murphy's sign.  Musculoskeletal:        General: Normal range of motion.  Skin:    General: Skin is warm and dry.  Neurological:     Mental Status: She is alert and oriented to person, place, and time.      UC Treatments / Results  Labs (all labs ordered are listed, but only abnormal results are displayed) Labs Reviewed - No data to display  EKG   Radiology No results  found.  Procedures Procedures (including critical care time)  Medications Ordered in UC Medications - No data to display  Initial Impression / Assessment and Plan / UC Course  I have reviewed the triage vital signs and the nursing notes.  Pertinent labs & imaging results that were available during my care of the patient were reviewed by me and considered in my medical decision making (see chart for details).  Currently afebrile, normotensive Appears uncomfortable on exam.  Pain 7/10 at rest but worse with palpation.  She is tender especially epigastric and right upper quadrant.  Discussed possible etiologies.  With patient discomfort, +Murphy's sign, I do recommend going to the emergency department for advanced imaging not available in the urgent care setting.  Patient verbalized understanding, discharged to ED via POV  Final Clinical Impressions(s) / UC Diagnoses   Final diagnoses:  RUQ pain   Discharge Instructions   None    ED Prescriptions   None    PDMP not reviewed this encounter.   Marlow Baars, [place] 11/06/23 1105

## 2023-11-06 NOTE — H&P (Signed)
 History and Physical    Patient: Olivia James ZOX:[id number] DOB: 08-14-1976 DOA: 11/06/2023 DOS: the patient was seen and examined on 11/06/2023 PCP: [facility], [facility]  Patient coming from: Home  Chief Complaint:  Chief Complaint  Patient presents with   Abdominal Pain   HPI: Olivia James is a 48 y.o. female with no pertinent past medical history presenting to the ED with RUQ pain.  Patient reports that she was in her usual state of health until yesterday afternoon.  Around 2:30 PM yesterday, she began having right upper quadrant abdominal pain that radiated to her back.  States that it felt like someone tied a rope around her upper stomach and was squeezing a tight.  Her pain persisted but she decided to wait it out.  She was able to eat a sandwich last night and able to keep it down.  Around 2 to 3 AM today, she did have 3 episodes of nonbilious nonbloody emesis.  No further episodes of vomiting thereafter.  She denies any fevers, chills, chest pain, palpitations, constipation, diarrhea, urinary changes.  She denies any prior history of abdominal pain or abdominal surgeries aside from a C-section.  Of note, patient was found to be bradycardic in the ED.  She notes that she was somewhat lightheaded earlier today but this has since improved and she is not currently feeling lightheaded or dizzy or having any vision changes.  ED course: Vital signs initially stable, however she did develop bradycardia to the 40s and hypotension to the 90s/60s while in the ER.  CBC unremarkable.  CMP with newly elevated liver enzymes with AST 1300s, ALT 1200s, total bilirubin 4.1.  Beta hCG negative.  Right upper quadrant ultrasound showing cholelithiasis without any sonographic evidence of acute cholecystitis.  GI, Dr. Levora Angel, consulted by ED provider and recommended hepatitis panel and MRCP for further evaluation.  [facility] hospitalist asked to evaluate patient for admission.  Review of Systems:  As mentioned in the history of present illness. All other systems reviewed and are negative. No past medical history on file. Past Surgical History:  Procedure Laterality Date   ARTHROSCOPY WITH ANTERIOR CRUCIATE LIGAMENT (ACL) REPAIR WITH ANTERIOR TIBILIAS GRAFT Right    c section     TUBAL LIGATION     WISDOM TOOTH EXTRACTION     Social History:  reports that she has quit smoking. Her smoking use included cigarettes. She has never used smokeless tobacco. She reports that she does not currently use alcohol. She reports that she does not use drugs.  No Known Allergies  No family history on file.  Prior to Admission medications   Medication Sig Start Date End Date Taking? Authorizing Provider  tranexamic acid (LYSTEDA) 650 MG TABS tablet Take 2 tablets (1,300 mg total) by mouth 3 (three) times daily. Take during menses for a maximum of five days 07/01/23   Sue Lush, FNP    Physical Exam: Vitals:   11/06/23 1833 11/06/23 1844 11/06/23 1911 11/06/23 1912  BP: 98/62  105/66   Pulse: (!) 55 (!) 42    Resp: 14  17   Temp:    98.1 F (36.7 C)  TempSrc:    Oral  SpO2: 99% 97%    Weight:      Height:       Physical Exam Constitutional:      Appearance: She is well-developed. She is not ill-appearing.  HENT:     Head: Normocephalic and atraumatic.     Mouth/Throat:  Mouth: Mucous membranes are dry.     Pharynx: Oropharynx is clear. No oropharyngeal exudate.  Eyes:     General: No scleral icterus.    Extraocular Movements: Extraocular movements intact.     Pupils: Pupils are equal, round, and reactive to light.  Cardiovascular:     Rate and Rhythm: Regular rhythm. Bradycardia present.     Heart sounds: Normal heart sounds. No murmur heard.    No friction rub. No gallop.  Pulmonary:     Effort: Pulmonary effort is normal.     Breath sounds: Normal breath sounds. No wheezing, rhonchi or rales.  Abdominal:     General: Bowel sounds are normal. There is no  distension.     Palpations: Abdomen is soft.     Tenderness: There is no guarding or rebound.     Comments: RUQ tenderness to palpation.  Musculoskeletal:        General: No swelling. Normal range of motion.     Cervical back: Normal range of motion.  Skin:    General: Skin is warm and dry.     Coloration: Skin is not jaundiced.  Neurological:     General: No focal deficit present.     Mental Status: She is alert and oriented to person, place, and time.  Psychiatric:        Mood and Affect: Mood normal.        Behavior: Behavior normal.     Data Reviewed:  Results are pending, will review when available.    Latest Ref Rng & Units 11/06/2023   12:04 PM 04/01/2023    7:18 PM  CBC  WBC 4.0 - 10.5 K/uL 4.4  5.1   Hemoglobin 12.0 - 15.0 g/dL 57.8  8.7   Hematocrit 36.0 - 46.0 % 42.7  30.7   Platelets 150 - 400 K/uL 166  236       Latest Ref Rng & Units 11/06/2023   12:04 PM 04/01/2023    7:18 PM  BMP  Glucose 70 - 99 mg/dL 97  93   BUN 6 - 20 mg/dL 14  7   Creatinine 4.69 - 1.00 mg/dL 6.29  5.28   Sodium 413 - 145 mmol/L 135  137   Potassium 3.5 - 5.1 mmol/L 3.7  3.4   Chloride 98 - 111 mmol/L 106  107   CO2 22 - 32 mmol/L 22  22   Calcium 8.9 - 10.3 mg/dL 9.6  8.8    Lipase     Component Value Date/Time   LIPASE 31 11/06/2023 1204   US Abdomen Limited RUQ (LIVER/GB) Result Date: 11/06/2023 CLINICAL DATA:  One day history of abdominal pain EXAM: ULTRASOUND ABDOMEN LIMITED RIGHT UPPER QUADRANT COMPARISON:  None Available. FINDINGS: Gallbladder: Innumerable gallstones measuring up to 8 mm. No wall thickening visualized. No sonographic Murphy sign noted by sonographer. Common bile duct: Diameter: 3 mm Liver: No focal lesion identified. Within normal limits in parenchymal echogenicity. Portal vein is patent on color Doppler imaging with normal direction of blood flow towards the liver. Other: None. IMPRESSION: Cholelithiasis without sonographic evidence of acute cholecystitis.  Electronically Signed   By: Agustin Cree M.D.   On: 11/06/2023 15:39    Assessment and Plan: No notes have been filed under this hospital service. Service: Hospitalist  Sharp RUQ abd pain radiating to back Newly elevated liver enzymes Cholelithiasis Patient presenting with acute onset right upper quadrant pain radiating to the back with associated nausea and NBNB vomiting.  She  was found to have newly elevated liver enzymes.  Right upper quadrant ultrasound showing cholelithiasis without any evidence of cholecystitis.  Her lab work appears to be more consistent with acute hepatitis.  GI consulted by ED provider, recommended obtaining a hepatitis panel and getting an MRCP for further evaluation.  MRCP will help to guide subsequent steps in management. Platelet count and INR normal. No signs of jaundice on exam. -GI following, appreciate assistance -Follow-up hepatitis panel -Follow-up MRCP -trend liver enzymes -Pain control with Tylenol every 6 hours as needed, oxycodone 5 mg every 4 hours as needed, Dilaudid 0.5 mg every 3 hours as needed -Zofran every 6 hours as needed for nausea -NPO -lovenox for dvt ppx (discussed with GI) -Place in observation  Hypotension Patient with blood pressures ranging in the 90s-100s/60s.  No history of hypotension per patient.  Suspect this is secondary to dehydration, vomiting, and decreased p.o. intake secondary to above.  Fortunately, her MAPs are sustaining above 65.  She received 2 L IV fluids in the ED.  Have placed her on maintenance IV fluids.  Will check lactic acid. -Continue maintenance IV fluids at 100 cc an hour for 10 hours -Follow-up lactic acid -Trend blood pressure curve, goal MAP greater than 65 -f/u TSH  Sinus Bradycardia Patient with heart rates in 30s to 40s at rest while in the ED.  She is currently asymptomatic.  EKG showing sinus bradycardia.  With ambulation, she does have an appropriate increase in her heart rates to the 70s to 80s but  this is not sustained.  Discussed with cardiology who recommended to monitor on telemetry overnight.  Given appropriate sympathetic response and not currently symptomatic, no urgent intervention or pacemaker indicated. -Cardiac telemetry -Follow-up TSH  Asymptomatic bacteriuria Urinalysis with positive nitrites and many bacteria, negative leukocytes no WBCs noted.  She does not have any urinary complaints.  No need for further workup.   Advance Care Planning:   Code Status: Full Code   Consults: GI  Family Communication: no family at bedside  Severity of Illness: The appropriate patient status for this patient is OBSERVATION. Observation status is judged to be reasonable and necessary in order to provide the required intensity of service to ensure the patient's safety. The patient's presenting symptoms, physical exam findings, and initial radiographic and laboratory data in the context of their medical condition is felt to place them at decreased risk for further clinical deterioration. Furthermore, it is anticipated that the patient will be medically stable for discharge from the hospital within 2 midnights of admission.   Portions of this note were generated with Scientist, clinical (histocompatibility and immunogenetics). Dictation errors may occur despite best attempts at proofreading.  Author: Briscoe Burns, MD 11/06/2023 7:48 PM  For on call review [email or web address].

## 2023-11-06 NOTE — ED Triage Notes (Signed)
 Pt c/o RLQ abd pain radiating to her back since yesterday, denies any urinary symptoms.

## 2023-11-06 NOTE — ED Notes (Signed)
 Patient is being discharged from the Urgent Care and sent to the Emergency Department via POV . Per Rebbecca Rissing, PA, patient is in need of higher level of care due to possible appendicitis. Patient is aware and verbalizes understanding of plan of care.  Vitals:   11/06/23 1031  BP: 120/80  Pulse: (!) 57  Resp: 18  Temp: 97.9 F (36.6 C)  SpO2: 98%

## 2023-11-06 NOTE — ED Provider Notes (Signed)
 [facility] AT [facility] Provider Note   CSN: [id number] Arrival date & time: 11/06/23  1102     History {Add pertinent medical, surgical, social history, OB history to HPI:1} Chief Complaint  Patient presents with   Abdominal Pain   HPI AZHANE ECKART is a 48 y.o. female presenting for abdominal pain.  Started yesterday afternoon.  Pain is in the right upper quadrant and radiates to the back and across the upper abdomen at times.  Feels sharp.  States that she took a Tums last night thinking it was reflux and it was worse.  This morning she woke up around 3 AM the pain was much worse and she vomited.  Denies any abnormal bowel changes or urinary symptoms.  Denies fever.   Abdominal Pain      Home Medications Prior to Admission medications   Medication Sig Start Date End Date Taking? Authorizing Provider  tranexamic acid (LYSTEDA) 650 MG TABS tablet Take 2 tablets (1,300 mg total) by mouth 3 (three) times daily. Take during menses for a maximum of five days 07/01/23   Sue Lush, FNP      Allergies    Patient has no known allergies.    Review of Systems   Review of Systems  Gastrointestinal:  Positive for abdominal pain.    Physical Exam Updated Vital Signs BP 121/70 (BP Location: Right Arm)   Pulse 65   Temp 98 F (36.7 C) (Oral)   Resp 16   Ht 5\' 7"  (1.702 m)   Wt 59 kg   LMP 10/14/2023 (Approximate)   SpO2 99%   BMI 20.36 kg/m  Physical Exam Vitals and nursing note reviewed.  HENT:     Head: Normocephalic and atraumatic.     Mouth/Throat:     Mouth: Mucous membranes are moist.  Eyes:     General:        Right eye: No discharge.        Left eye: No discharge.     Conjunctiva/sclera: Conjunctivae normal.  Cardiovascular:     Rate and Rhythm: Normal rate and regular rhythm.     Pulses: Normal pulses.     Heart sounds: Normal heart sounds.  Pulmonary:     Effort: Pulmonary effort is normal.     Breath  sounds: Normal breath sounds.  Abdominal:     General: Abdomen is flat.     Palpations: Abdomen is soft.     Tenderness: There is abdominal tenderness in the right upper quadrant. There is no right CVA tenderness or left CVA tenderness.  Skin:    General: Skin is warm and dry.  Neurological:     General: No focal deficit present.  Psychiatric:        Mood and Affect: Mood normal.     ED Results / Procedures / Treatments   Labs (all labs ordered are listed, but only abnormal results are displayed) Labs Reviewed  COMPREHENSIVE METABOLIC PANEL - Abnormal; Notable for the following components:      Result Value   AST 1,318 (*)    ALT 1,208 (*)    Total Bilirubin 4.1 (*)    All other components within normal limits  LIPASE, BLOOD  CBC  HCG, SERUM, QUALITATIVE  URINALYSIS, ROUTINE W REFLEX MICROSCOPIC    EKG None  Radiology No results found.  Procedures Procedures  {Document cardiac monitor, telemetry assessment procedure when appropriate:1}  Medications Ordered in ED Medications  sodium chloride 0.9 %  bolus 1,000 mL (1,000 mLs Intravenous New Bag/Given 11/06/23 1418)  ondansetron (ZOFRAN) injection 4 mg (4 mg Intravenous Given 11/06/23 1406)  HYDROmorphone (DILAUDID) injection 0.5 mg (0.5 mg Intravenous Given 11/06/23 1417)    ED Course/ Medical Decision Making/ A&P   {   Click here for ABCD2, HEART and other calculatorsREFRESH Note before signing :1}                              Medical Decision Making Amount and/or Complexity of Data Reviewed Labs: ordered. Radiology: ordered.  Risk Prescription drug management.   Initial Impression and Ddx 48 year old well-appearing female presenting for right upper quadrant pain.  Exam notable for right upper quadrant tenderness.  DDx includes acute cholecystitis, acute hepatitis, biliary colic, gallstones, nephrolithiasis, pyelonephritis, UTI, pancreatitis, other. Patient PMH that increases complexity of ED encounter:  s/p  c section  Interpretation of Diagnostics - I independent reviewed and interpreted the labs as followed: elevated AST/ALT, elevated total bili  - RUQ U/S pending  Patient Reassessment and Ultimate Disposition/Management Workup was concerning for gallbladder pathology, but common bile duct obstruction.  RUQ ultrasound is pending.  On reassessment patient's pain had improved.   Patient management required discussion with the following services or consulting groups:  {BEROCONSULT:26841}  Complexity of Problems Addressed {BEROCOPA:26833}  Additional Data Reviewed and Analyzed Further history obtained from: {BERODATA:26834}  Patient Encounter Risk Assessment {BERORISK:26838}   {Document critical care time when appropriate:1} {Document review of labs and clinical decision tools ie heart score, Chads2Vasc2 etc:1}  {Document your independent review of radiology images, and any outside records:1} {Document your discussion with family members, caretakers, and with consultants:1} {Document social determinants of health affecting pt's care:1} {Document your decision making why or why not admission, treatments were needed:1} Final Clinical Impression(s) / ED Diagnoses Final diagnoses:  None    Rx / DC Orders ED Discharge Orders     None

## 2023-11-06 NOTE — ED Provider Notes (Signed)
 Received patient from previous provider pending right upper quadrant ultrasound.  See his note  In short, patient presents to emergency department for evaluation of sharp RUQ abdominal pain that radiates to the back with 1 episode of vomiting that started at 0300 this morning.  Patient received morphine, Zofran, 1L NS for symptoms with some improvement.  ED workup was notable for significant transaminitis (AST 1318, ALT 1208, T. bili 4.1) all elevated from baseline that was WNL 7 months ago. PMHx of IDA, migraines.  Consulted GI Dr. Levora Angel who recommended MRCP, hepatitis panel.  Upon reassessment at 1830, patient's HR decreased to 40-50bpm palpated. BP 105 SBP, Temp 98.66F. No complaints of dizziness, CP, SHOB. Ordered an additional LR, EKG.  Consulted Dr. Merrilyn Puma who accept patient for admission     Judithann Sheen, [place] 11/06/23 Shaune Pascal, MD 11/07/23 2224

## 2023-11-06 NOTE — ED Notes (Signed)
Pt aware of need for urine specimen. 

## 2023-11-06 NOTE — ED Triage Notes (Signed)
 Pt reports right side pain starting yesterday, by evening had moved to mid abd and around to back. Threw up a few times around 0300. Pain worse with muscle tension. Denies urinary sx

## 2023-11-07 ENCOUNTER — Encounter (HOSPITAL_COMMUNITY): Payer: Self-pay | Admitting: Internal Medicine

## 2023-11-07 DIAGNOSIS — Z604 Social exclusion and rejection: Secondary | ICD-10-CM | POA: Diagnosis present

## 2023-11-07 DIAGNOSIS — K8050 Calculus of bile duct without cholangitis or cholecystitis without obstruction: Secondary | ICD-10-CM | POA: Diagnosis not present

## 2023-11-07 DIAGNOSIS — Z9889 Other specified postprocedural states: Secondary | ICD-10-CM | POA: Diagnosis not present

## 2023-11-07 DIAGNOSIS — N3000 Acute cystitis without hematuria: Secondary | ICD-10-CM | POA: Diagnosis not present

## 2023-11-07 DIAGNOSIS — K8080 Other cholelithiasis without obstruction: Secondary | ICD-10-CM | POA: Diagnosis not present

## 2023-11-07 DIAGNOSIS — E876 Hypokalemia: Secondary | ICD-10-CM | POA: Diagnosis not present

## 2023-11-07 DIAGNOSIS — N390 Urinary tract infection, site not specified: Secondary | ICD-10-CM | POA: Diagnosis present

## 2023-11-07 DIAGNOSIS — K8060 Calculus of gallbladder and bile duct with cholecystitis, unspecified, without obstruction: Secondary | ICD-10-CM | POA: Diagnosis not present

## 2023-11-07 DIAGNOSIS — K828 Other specified diseases of gallbladder: Secondary | ICD-10-CM | POA: Diagnosis present

## 2023-11-07 DIAGNOSIS — G43909 Migraine, unspecified, not intractable, without status migrainosus: Secondary | ICD-10-CM | POA: Diagnosis present

## 2023-11-07 DIAGNOSIS — Z91040 Latex allergy status: Secondary | ICD-10-CM | POA: Diagnosis not present

## 2023-11-07 DIAGNOSIS — R1011 Right upper quadrant pain: Secondary | ICD-10-CM

## 2023-11-07 DIAGNOSIS — K8062 Calculus of gallbladder and bile duct with acute cholecystitis without obstruction: Secondary | ICD-10-CM | POA: Diagnosis present

## 2023-11-07 DIAGNOSIS — R7401 Elevation of levels of liver transaminase levels: Secondary | ICD-10-CM

## 2023-11-07 DIAGNOSIS — F17210 Nicotine dependence, cigarettes, uncomplicated: Secondary | ICD-10-CM | POA: Diagnosis present

## 2023-11-07 DIAGNOSIS — Z98891 History of uterine scar from previous surgery: Secondary | ICD-10-CM | POA: Diagnosis not present

## 2023-11-07 DIAGNOSIS — G43901 Migraine, unspecified, not intractable, with status migrainosus: Secondary | ICD-10-CM | POA: Diagnosis present

## 2023-11-07 DIAGNOSIS — Z9851 Tubal ligation status: Secondary | ICD-10-CM | POA: Diagnosis not present

## 2023-11-07 DIAGNOSIS — B9620 Unspecified Escherichia coli [E. coli] as the cause of diseases classified elsewhere: Secondary | ICD-10-CM | POA: Diagnosis present

## 2023-11-07 DIAGNOSIS — F17290 Nicotine dependence, other tobacco product, uncomplicated: Secondary | ICD-10-CM | POA: Diagnosis present

## 2023-11-07 DIAGNOSIS — K851 Biliary acute pancreatitis without necrosis or infection: Secondary | ICD-10-CM | POA: Diagnosis present

## 2023-11-07 DIAGNOSIS — D696 Thrombocytopenia, unspecified: Secondary | ICD-10-CM | POA: Diagnosis present

## 2023-11-07 DIAGNOSIS — Z79899 Other long term (current) drug therapy: Secondary | ICD-10-CM | POA: Diagnosis not present

## 2023-11-07 DIAGNOSIS — K8043 Calculus of bile duct with acute cholecystitis with obstruction: Secondary | ICD-10-CM | POA: Diagnosis not present

## 2023-11-07 LAB — COMPREHENSIVE METABOLIC PANEL
ALT: 683 U/L — ABNORMAL HIGH (ref 0–44)
AST: 430 U/L — ABNORMAL HIGH (ref 15–41)
Albumin: 3.1 g/dL — ABNORMAL LOW (ref 3.5–5.0)
Alkaline Phosphatase: 82 U/L (ref 38–126)
Anion gap: 6 (ref 5–15)
BUN: 11 mg/dL (ref 6–20)
CO2: 20 mmol/L — ABNORMAL LOW (ref 22–32)
Calcium: 8.3 mg/dL — ABNORMAL LOW (ref 8.9–10.3)
Chloride: 111 mmol/L (ref 98–111)
Creatinine, Ser: 0.54 mg/dL (ref 0.44–1.00)
GFR, Estimated: >60 mL/min (ref >60)
Glucose, Bld: 83 mg/dL (ref 70–99)
Potassium: 3.4 mmol/L — ABNORMAL LOW (ref 3.5–5.1)
Sodium: 137 mmol/L (ref 135–145)
Total Bilirubin: 5.1 mg/dL — ABNORMAL HIGH (ref 0.0–1.2)
Total Protein: 5.4 g/dL — ABNORMAL LOW (ref 6.5–8.1)

## 2023-11-07 LAB — CBC
HCT: 33.2 % — ABNORMAL LOW (ref 36.0–46.0)
Hemoglobin: 11.3 g/dL — ABNORMAL LOW (ref 12.0–15.0)
MCH: 30.8 pg (ref 26.0–34.0)
MCHC: 34 g/dL (ref 30.0–36.0)
MCV: 90.5 fL (ref 80.0–100.0)
Platelets: 138 10*3/uL — ABNORMAL LOW (ref 150–400)
RBC: 3.67 MIL/uL — ABNORMAL LOW (ref 3.87–5.11)
RDW: 13.1 % (ref 11.5–15.5)
WBC: 3.7 10*3/uL — ABNORMAL LOW (ref 4.0–10.5)
nRBC: 0 % (ref 0.0–0.2)

## 2023-11-07 LAB — HIV ANTIBODY (ROUTINE TESTING W REFLEX): HIV Screen 4th Generation wRfx: NONREACTIVE

## 2023-11-07 LAB — MAGNESIUM: Magnesium: 2.1 mg/dL (ref 1.7–2.4)

## 2023-11-07 MED ORDER — POTASSIUM CHLORIDE CRYS ER 20 MEQ PO TBCR
40.0000 meq | EXTENDED_RELEASE_TABLET | Freq: Once | ORAL | Status: AC
  Administered 2023-11-07: 40 meq via ORAL
  Filled 2023-11-07: qty 2

## 2023-11-07 MED ORDER — SODIUM CHLORIDE 0.9 % IV SOLN
1.0000 g | INTRAVENOUS | Status: DC
  Administered 2023-11-07 – 2023-11-10 (×4): 1 g via INTRAVENOUS
  Filled 2023-11-07 (×5): qty 10

## 2023-11-07 MED ORDER — LACTATED RINGERS IV SOLN: INTRAVENOUS | Status: AC

## 2023-11-07 MED ORDER — CARMEX CLASSIC LIP BALM EX OINT
TOPICAL_OINTMENT | CUTANEOUS | Status: DC | PRN
  Filled 2023-11-07: qty 10

## 2023-11-07 MED ORDER — ORAL CARE MOUTH RINSE: 15.0000 mL | OROMUCOSAL | Status: DC | PRN

## 2023-11-07 NOTE — Progress Notes (Signed)
 [facility]                                                                               Olivia James, is a 48 y.o. female, DOB - [birth year], WUJ:[id number] Admit date - 11/06/2023    Outpatient Primary MD for the patient is [facility]  LOS - 0  days    Brief summary   Olivia James is a 48 y.o. female with no pertinent past medical history presenting to the ED with RUQ pain.   Assessment & Plan    Assessment and Plan:  RUQ PAIN associated with nausea and vomiting.  Choledocholithiasis,  stone in the distal CBD with dilatation of the CBD duct on MRCP GI on board, plan for ERCP in am.  Pain control and on clears today. Continue with IV fluids and symptomatic management with anti emetics.  Gen surgery consulted, plan for lap cholecystectomy.  She remains afebrile, normal wbc count.  Lipase wnl.  Elevated liver enzymes on admission in thousands improving slowly.  T bili elevated at 5.1.  Continue to monitor.  LA is wnl.  Acute hepatitis panel is negative.    Abnormal UA, foul smelling urine. And some abdominal discomfort.  Get urine cultures.  Continue with Ceftriaxone.    Mild thrombocytopenia Platelets of 138,000. Monitor.   Estimated body mass index is 20.36 kg/m as calculated from the following:   Height as of this encounter: 5\' 7"  (1.702 m).   Weight as of this encounter: 59 kg.  Code Status: full code.  DVT Prophylaxis:  enoxaparin (LOVENOX) injection 40 mg Start: 11/06/23 2200   Level of Care: Level of care: Progressive Family Communication: Updated patient's family at bedside.   Disposition Plan:     Remains inpatient appropriate:  ERCP in am  Procedures:  MRCP  Consultants:   GI General surgery.   Antimicrobials:   Anti-infectives (From admission, onward)    Start     Dose/Rate Route Frequency Ordered Stop   11/07/23 0900  cefTRIAXone (ROCEPHIN) 1 g in sodium chloride 0.9 % 100 mL IVPB        1 g 200 mL/hr  over 30 Minutes Intravenous Every 24 hours 11/07/23 0742          Medications  Scheduled Meds:  enoxaparin (LOVENOX) injection  40 mg Subcutaneous Q24H   Continuous Infusions:  cefTRIAXone (ROCEPHIN)  IV 1 g (11/07/23 0859)   PRN Meds:.acetaminophen **OR** acetaminophen, HYDROmorphone (DILAUDID) injection, lip balm, LORazepam, ondansetron **OR** ondansetron (ZOFRAN) IV, mouth rinse, oxyCODONE    Subjective:   Olivia James was seen and examined today. No nausea or vomiting.    Objective:   Vitals:   11/07/23 0022 11/07/23 0418 11/07/23 0730 11/07/23 1244  BP: (!) 101/51 (!) 95/49 (!) 104/58 (!) 103/54  Pulse: (!) 51  (!) 55 (!) 59  Resp: 18 18  16   Temp: 98.7 F (37.1 C) 98.6 F (37 C) 98.7 F (37.1 C) 98.7 F (37.1 C)  TempSrc: Oral Oral Oral Oral  SpO2: 98% 97% 99% 99%  Weight:      Height:        Intake/Output Summary (Last 24 hours) at  11/07/2023 1337 Last data filed at 11/07/2023 1200 Gross per 24 hour  Intake 2149.14 ml  Output --  Net 2149.14 ml   Filed Weights   11/06/23 1151  Weight: 59 kg     Exam  General exam: Appears calm and comfortable  Respiratory system: Clear to auscultation. Respiratory effort normal. Cardiovascular system: S1 & S2 heard, RRR.  Gastrointestinal system: Abdomen is nondistended, soft and mild RUQ tenderness. Central nervous system: Alert and oriented. No focal neurological deficits. Extremities: Symmetric 5 x 5 power. Skin: No rashes, lesions or ulcers Psychiatry: mood is appropriate.   Data Reviewed:  I have personally reviewed following labs and imaging studies   CBC Lab Results  Component Value Date   WBC 3.7 (L) 11/07/2023   RBC 3.67 (L) 11/07/2023   HGB 11.3 (L) 11/07/2023   HCT 33.2 (L) 11/07/2023   MCV 90.5 11/07/2023   MCH 30.8 11/07/2023   PLT 138 (L) 11/07/2023   MCHC 34.0 11/07/2023   RDW 13.1 11/07/2023     Last metabolic panel Lab Results  Component Value Date   NA 137 11/07/2023    K 3.4 (L) 11/07/2023   CL 111 11/07/2023   CO2 20 (L) 11/07/2023   BUN 11 11/07/2023   CREATININE 0.54 11/07/2023   GLUCOSE 83 11/07/2023   GFRNONAA >60 11/07/2023   CALCIUM 8.3 (L) 11/07/2023   PROT 5.4 (L) 11/07/2023   ALBUMIN 3.1 (L) 11/07/2023   BILITOT 5.1 (H) 11/07/2023   ALKPHOS 82 11/07/2023   AST 430 (H) 11/07/2023   ALT 683 (H) 11/07/2023   ANIONGAP 6 11/07/2023    CBG (last 3)  No results for input(s): "GLUCAP" in the last 72 hours.    Coagulation Profile: Recent Labs  Lab 11/06/23 1949  INR 1.2     Radiology Studies: MR ABDOMEN MRCP W WO CONTAST Result Date: 11/07/2023 CLINICAL DATA:  Abdominal pain with cholelithiasis on ultrasound examination EXAM: MRI ABDOMEN WITHOUT AND WITH CONTRAST (INCLUDING MRCP) TECHNIQUE: Multiplanar multisequence MR imaging of the abdomen was performed both before and after the administration of intravenous contrast. Heavily T2-weighted images of the biliary and pancreatic ducts were obtained. Post-processing was applied at the acquisition scanner with concurrent physician supervision which includes 3D reconstructions, MIPs, volume rendered images and/or shaded surface rendering. CONTRAST:  6mL GADAVIST GADOBUTROL 1 MMOL/ML IV SOLN COMPARISON:  Earlier same day right upper quadrant ultrasound examination FINDINGS: Lower chest: No acute findings. Hepatobiliary: No mass or other parenchymal abnormality identified. Common bile duct measures 7 mm, at the upper limits of normal with at least 2 filling defects in the downstream duct measuring up to 2 mm (3:14). Distended gallbladder with cholelithiasis. Interval development of trace pericholecystic free fluid and mild periportal edema. Pancreas: No mass, inflammatory changes, or other parenchymal abnormality identified. Spleen:  Splenomegaly measures 14.3 cm. Adrenals/Urinary Tract: No adrenal nodules. No suspicious renal masses identified. No evidence of hydronephrosis. Stomach/Bowel: Visualized  portions within the abdomen are unremarkable. Vascular/Lymphatic: No pathologically enlarged lymph nodes identified. No abdominal aortic aneurysm demonstrated. Other:  None. Musculoskeletal: No suspicious bone lesions identified. IMPRESSION: 1. Choledocholithiasis with punctate, 2 mm stones in the downstream duct. 2. Distended gallbladder with cholelithiasis. Interval development of trace pericholecystic free fluid and mild periportal edema, likely reactive or related to fluid resuscitation. 3. Splenomegaly measures 14.3 cm. Electronically Signed   By: Agustin Cree M.D.   On: 11/07/2023 08:36   MR 3D Recon At Scanner Result Date: 11/07/2023 CLINICAL DATA:  Abdominal pain with cholelithiasis  on ultrasound examination EXAM: MRI ABDOMEN WITHOUT AND WITH CONTRAST (INCLUDING MRCP) TECHNIQUE: Multiplanar multisequence MR imaging of the abdomen was performed both before and after the administration of intravenous contrast. Heavily T2-weighted images of the biliary and pancreatic ducts were obtained. Post-processing was applied at the acquisition scanner with concurrent physician supervision which includes 3D reconstructions, MIPs, volume rendered images and/or shaded surface rendering. CONTRAST:  6mL GADAVIST GADOBUTROL 1 MMOL/ML IV SOLN COMPARISON:  Earlier same day right upper quadrant ultrasound examination FINDINGS: Lower chest: No acute findings. Hepatobiliary: No mass or other parenchymal abnormality identified. Common bile duct measures 7 mm, at the upper limits of normal with at least 2 filling defects in the downstream duct measuring up to 2 mm (3:14). Distended gallbladder with cholelithiasis. Interval development of trace pericholecystic free fluid and mild periportal edema. Pancreas: No mass, inflammatory changes, or other parenchymal abnormality identified. Spleen:  Splenomegaly measures 14.3 cm. Adrenals/Urinary Tract: No adrenal nodules. No suspicious renal masses identified. No evidence of hydronephrosis.  Stomach/Bowel: Visualized portions within the abdomen are unremarkable. Vascular/Lymphatic: No pathologically enlarged lymph nodes identified. No abdominal aortic aneurysm demonstrated. Other:  None. Musculoskeletal: No suspicious bone lesions identified. IMPRESSION: 1. Choledocholithiasis with punctate, 2 mm stones in the downstream duct. 2. Distended gallbladder with cholelithiasis. Interval development of trace pericholecystic free fluid and mild periportal edema, likely reactive or related to fluid resuscitation. 3. Splenomegaly measures 14.3 cm. Electronically Signed   By: Agustin Cree M.D.   On: 11/07/2023 08:36   US Abdomen Limited RUQ (LIVER/GB) Result Date: 11/06/2023 CLINICAL DATA:  One day history of abdominal pain EXAM: ULTRASOUND ABDOMEN LIMITED RIGHT UPPER QUADRANT COMPARISON:  None Available. FINDINGS: Gallbladder: Innumerable gallstones measuring up to 8 mm. No wall thickening visualized. No sonographic Murphy sign noted by sonographer. Common bile duct: Diameter: 3 mm Liver: No focal lesion identified. Within normal limits in parenchymal echogenicity. Portal vein is patent on color Doppler imaging with normal direction of blood flow towards the liver. Other: None. IMPRESSION: Cholelithiasis without sonographic evidence of acute cholecystitis. Electronically Signed   By: Agustin Cree M.D.   On: 11/06/2023 15:39       Kathlen Mody M.D. [facility] 11/07/2023, 1:37 PM  Available via Epic secure chat 7am-7pm After 7 pm, please refer to night coverage provider listed on amion.

## 2023-11-07 NOTE — Consult Note (Signed)
 Referring Provider: [facility] Primary Care Physician:  [facility], [facility] Primary Gastroenterologist: Gentry Fitz  Reason for Consultation: Pain, LFTs  HPI: Olivia James is a 48 y.o. female with no significant past medical history presented to the hospital with acute abdominal pain.  Blood work during initial evaluation in the emergency department showed significant elevated AST of 12-18, ALT of 1208, T. bili of 4.1 and normal alkaline phosphatase.  Normal CBC.  Normal lipase.  Normal INR.  Negative hepatitis panel.  Ultrasound abdomen right upper quadrant showed gallstones without any evidence of acute cholecystitis.  CBD of 3 mm.  GI was contacted by emergency department.  MRI was recommended which showed 2 mm stone in the distal CBD with mild dilation of CBD of 7 mm.  Patient seen and examined at bedside.  She had episodes of nausea and vomiting 2 days ago which was followed by acute episode of abdominal pain.  Denies any diarrhea or constipation.  Denies similar symptoms in the past.  Symptoms improved significantly since hospitalization.  No past medical history on file.  Past Surgical History:  Procedure Laterality Date   ARTHROSCOPY WITH ANTERIOR CRUCIATE LIGAMENT (ACL) REPAIR WITH ANTERIOR TIBILIAS GRAFT Right    c section     TUBAL LIGATION     WISDOM TOOTH EXTRACTION      Prior to Admission medications   Medication Sig Start Date End Date Taking? Authorizing Provider  MOTRIN IB 200 MG CAPS Take 200-800 mg by mouth every 6 (six) hours as needed (for pain or headaches).   Yes [provider]  NURTEC 75 MG TBDP Take 75 mg by mouth daily as needed (for migraine- dissolve orally- max of 75 mg/24 hours).   Yes [provider]  topiramate (TOPAMAX) 50 MG tablet Take 50 mg by mouth at bedtime. 10/25/23  Yes [provider]  tranexamic acid (LYSTEDA) 650 MG TABS tablet Take 2 tablets (1,300 mg total) by mouth 3 (three) times daily. Take during menses for a  maximum of five days 07/01/23  Yes Sue Lush, FNP  Vitamin D, Ergocalciferol, (DRISDOL) 1.25 MG (50000 UNIT) CAPS capsule Take 50,000 Units by mouth every Wednesday.   Yes [provider]  ACCRUFER 30 MG CAPS Take 30 mg by mouth 2 (two) times daily. Patient not taking: Reported on 11/06/2023 10/25/23   [provider]    Scheduled Meds:  enoxaparin (LOVENOX) injection  40 mg Subcutaneous Q24H   Continuous Infusions:  cefTRIAXone (ROCEPHIN)  IV 1 g (11/07/23 0859)   PRN Meds:.acetaminophen **OR** acetaminophen, HYDROmorphone (DILAUDID) injection, lip balm, LORazepam, ondansetron **OR** ondansetron (ZOFRAN) IV, mouth rinse, oxyCODONE  Allergies as of 11/06/2023 - Review Complete 11/06/2023  Allergen Reaction Noted   Latex Rash 11/06/2023    No family history on file.  Social History   Socioeconomic History   Marital status: Divorced    Spouse name: Not on file   Number of children: Not on file   Years of education: Not on file   Highest education level: Not on file  Occupational History   Not on file  Tobacco Use   Smoking status: Former    Current packs/day: 1.00    Types: Cigarettes   Smokeless tobacco: Never  Vaping Use   Vaping status: Every Day  Substance and Sexual Activity   Alcohol use: Not Currently   Drug use: Never   Sexual activity: Yes    Partners: Male    Birth control/protection: Surgical  Other Topics Concern  Not on file  Social History Narrative   Not on file   Social Drivers of Health   Financial Resource Strain: Not on file  Food Insecurity: No Food Insecurity (11/06/2023)   Hunger Vital Sign    Worried About Running Out of Food in the Last Year: Never true    Ran Out of Food in the Last Year: Never true  Transportation Needs: No Transportation Needs (11/06/2023)   PRAPARE - Administrator, Civil Service (Medical): No    Lack of Transportation (Non-Medical): No  Physical Activity: Not on file  Stress: Not  on file  Social Connections: Socially Isolated (11/06/2023)   Social Connection and Isolation Panel [NHANES]    Frequency of Communication with Friends and Family: More than three times a week    Frequency of Social Gatherings with Friends and Family: More than three times a week    Attends Religious Services: Never    Database administrator or Organizations: No    Attends Banker Meetings: Never    Marital Status: Divorced  Catering manager Violence: Not At Risk (11/06/2023)   Humiliation, Afraid, Rape, and Kick questionnaire    Fear of Current or Ex-Partner: No    Emotionally Abused: No    Physically Abused: No    Sexually Abused: No    Review of Systems: All negative except as stated above in HPI.  Physical Exam: Vital signs: Vitals:   11/07/23 0418 11/07/23 0730  BP: (!) 95/49 (!) 104/58  Pulse:  (!) 55  Resp: 18   Temp: 98.6 F (37 C) 98.7 F (37.1 C)  SpO2: 97% 99%   Last BM Date : 11/05/23 Physical Exam Constitutional:      General: She is not in acute distress.    Appearance: She is well-developed.  HENT:     Head: Normocephalic and atraumatic.  Eyes:     Extraocular Movements: Extraocular movements intact.  Cardiovascular:     Rate and Rhythm: Normal rate and regular rhythm.     Heart sounds: Normal heart sounds. No murmur heard. Pulmonary:     Effort: Pulmonary effort is normal. No respiratory distress.  Abdominal:     General: Bowel sounds are normal. There is no distension.     Palpations: Abdomen is soft.     Tenderness: There is no abdominal tenderness.     Hernia: No hernia is present.  Skin:    General: Skin is warm.     Coloration: Skin is not jaundiced.  Neurological:     General: No focal deficit present.     Mental Status: She is alert.  Psychiatric:        Mood and Affect: Mood normal. Mood is not anxious or depressed.        Behavior: Behavior normal.      GI:  Lab Results: Recent Labs    11/06/23 1204 11/07/23 0529   WBC 4.4 3.7*  HGB 14.5 11.3*  HCT 42.7 33.2*  PLT 166 138*   BMET Recent Labs    11/06/23 1204 11/07/23 0529  NA 135 137  K 3.7 3.4*  CL 106 111  CO2 22 20*  GLUCOSE 97 83  BUN 14 11  CREATININE 0.51 0.54  CALCIUM 9.6 8.3*   LFT Recent Labs    11/07/23 0529  PROT 5.4*  ALBUMIN 3.1*  AST 430*  ALT 683*  ALKPHOS 82  BILITOT 5.1*   PT/INR Recent Labs    11/06/23 1949  LABPROT 15.2  INR 1.2     Studies/Results: MR ABDOMEN MRCP W WO CONTAST Result Date: 11/07/2023 CLINICAL DATA:  Abdominal pain with cholelithiasis on ultrasound examination EXAM: MRI ABDOMEN WITHOUT AND WITH CONTRAST (INCLUDING MRCP) TECHNIQUE: Multiplanar multisequence MR imaging of the abdomen was performed both before and after the administration of intravenous contrast. Heavily T2-weighted images of the biliary and pancreatic ducts were obtained. Post-processing was applied at the acquisition scanner with concurrent physician supervision which includes 3D reconstructions, MIPs, volume rendered images and/or shaded surface rendering. CONTRAST:  6mL GADAVIST GADOBUTROL 1 MMOL/ML IV SOLN COMPARISON:  Earlier same day right upper quadrant ultrasound examination FINDINGS: Lower chest: No acute findings. Hepatobiliary: No mass or other parenchymal abnormality identified. Common bile duct measures 7 mm, at the upper limits of normal with at least 2 filling defects in the downstream duct measuring up to 2 mm (3:14). Distended gallbladder with cholelithiasis. Interval development of trace pericholecystic free fluid and mild periportal edema. Pancreas: No mass, inflammatory changes, or other parenchymal abnormality identified. Spleen:  Splenomegaly measures 14.3 cm. Adrenals/Urinary Tract: No adrenal nodules. No suspicious renal masses identified. No evidence of hydronephrosis. Stomach/Bowel: Visualized portions within the abdomen are unremarkable. Vascular/Lymphatic: No pathologically enlarged lymph nodes identified.  No abdominal aortic aneurysm demonstrated. Other:  None. Musculoskeletal: No suspicious bone lesions identified. IMPRESSION: 1. Choledocholithiasis with punctate, 2 mm stones in the downstream duct. 2. Distended gallbladder with cholelithiasis. Interval development of trace pericholecystic free fluid and mild periportal edema, likely reactive or related to fluid resuscitation. 3. Splenomegaly measures 14.3 cm. Electronically Signed   By: Agustin Cree M.D.   On: 11/07/2023 08:36   MR 3D Recon At Scanner Result Date: 11/07/2023 CLINICAL DATA:  Abdominal pain with cholelithiasis on ultrasound examination EXAM: MRI ABDOMEN WITHOUT AND WITH CONTRAST (INCLUDING MRCP) TECHNIQUE: Multiplanar multisequence MR imaging of the abdomen was performed both before and after the administration of intravenous contrast. Heavily T2-weighted images of the biliary and pancreatic ducts were obtained. Post-processing was applied at the acquisition scanner with concurrent physician supervision which includes 3D reconstructions, MIPs, volume rendered images and/or shaded surface rendering. CONTRAST:  6mL GADAVIST GADOBUTROL 1 MMOL/ML IV SOLN COMPARISON:  Earlier same day right upper quadrant ultrasound examination FINDINGS: Lower chest: No acute findings. Hepatobiliary: No mass or other parenchymal abnormality identified. Common bile duct measures 7 mm, at the upper limits of normal with at least 2 filling defects in the downstream duct measuring up to 2 mm (3:14). Distended gallbladder with cholelithiasis. Interval development of trace pericholecystic free fluid and mild periportal edema. Pancreas: No mass, inflammatory changes, or other parenchymal abnormality identified. Spleen:  Splenomegaly measures 14.3 cm. Adrenals/Urinary Tract: No adrenal nodules. No suspicious renal masses identified. No evidence of hydronephrosis. Stomach/Bowel: Visualized portions within the abdomen are unremarkable. Vascular/Lymphatic: No pathologically enlarged  lymph nodes identified. No abdominal aortic aneurysm demonstrated. Other:  None. Musculoskeletal: No suspicious bone lesions identified. IMPRESSION: 1. Choledocholithiasis with punctate, 2 mm stones in the downstream duct. 2. Distended gallbladder with cholelithiasis. Interval development of trace pericholecystic free fluid and mild periportal edema, likely reactive or related to fluid resuscitation. 3. Splenomegaly measures 14.3 cm. Electronically Signed   By: Agustin Cree M.D.   On: 11/07/2023 08:36   US Abdomen Limited RUQ (LIVER/GB) Result Date: 11/06/2023 CLINICAL DATA:  One day history of abdominal pain EXAM: ULTRASOUND ABDOMEN LIMITED RIGHT UPPER QUADRANT COMPARISON:  None Available. FINDINGS: Gallbladder: Innumerable gallstones measuring up to 8 mm. No wall thickening visualized. No sonographic Murphy sign  noted by sonographer. Common bile duct: Diameter: 3 mm Liver: No focal lesion identified. Within normal limits in parenchymal echogenicity. Portal vein is patent on color Doppler imaging with normal direction of blood flow towards the liver. Other: None. IMPRESSION: Cholelithiasis without sonographic evidence of acute cholecystitis. Electronically Signed   By: Agustin Cree M.D.   On: 11/06/2023 15:39    Impression/Plan: -Choledocholithiasis with MRI confirming 2 mm stone in the CBD. -Cholelithiasis.  Ultrasound showing multiple gallbladder stones. -Acute onset of abdominal pain, nausea and vomiting.  Likely from choledocholithiasis.  Improving. -Abnormal LFTs.  Improving.  Recommendations ------------------------- -Plan for ERCP tomorrow with Dr. Elnoria Howard. -Recommend surgical evaluation for cholecystectomy -Okay to have full liquid diet today.  Keep n.p.o. past midnight. -Repeat labs in the morning. -GI will follow  Risks (post ERCP pancreatitis, bleeding, infection, bowel perforation that could require surgery, sedation-related changes in cardiopulmonary systems), benefits (identification and  possible treatment of source of symptoms, exclusion of certain causes of symptoms), and alternatives (watchful waiting, radiographic imaging studies, empiric medical treatment)  were explained to patient/family in detail and patient wishes to proceed.     LOS: 0 days   Kathi Der  MD, FACP 11/07/2023, 9:33 AM  Contact #  [phone]

## 2023-11-07 NOTE — Consult Note (Signed)
 Reason for Consult:gallstones with CBD stone Referring Physician: Dr. Irma Newness Olivia James is an 48 y.o. female.  HPI: This is a pleasant 77 female who was admitted yesterday.  She presented with 1 day history of lower quadrant abdominal.  She has had milder similar attacks past.  Several episodes of emesis.  She is otherwise healthy.  She has no cardiopulmonary issues.  She does report previous surgeries that she has been slow to come out of anesthesia. She was found to have elevated liver function tests with a bilirubin of 4.1 and an AST and ALT about the thousand.  She underwent an MRCP today which shows a 2 mm stone in the distal common bile duct.  She also has distended gallbladder with gallstones with trace pericholecystic fluid and mild periportal edema.  She has been seen by gastroenterology who is planning a likely ERCP tomorrow.  Her labs today show that the bilirubin has increased to 5.1 but her AST and ALT have improved.  She is currently comfortable with only mild discomfort and no further nausea  No past medical history on file.  Past Surgical History:  Procedure Laterality Date   ARTHROSCOPY WITH ANTERIOR CRUCIATE LIGAMENT (ACL) REPAIR WITH ANTERIOR TIBILIAS GRAFT Right    c section     TUBAL LIGATION     WISDOM TOOTH EXTRACTION      No family history on file.  Social History:  reports that she has quit smoking. Her smoking use included cigarettes. She has never used smokeless tobacco. She reports that she does not currently use alcohol. She reports that she does not use drugs.  Allergies:  Allergies  Allergen Reactions   Latex Rash    Medications: I have reviewed the patient's current medications.  Results for orders placed or performed during the hospital encounter of 11/06/23 (from the past 48 hours)  Lipase, blood     Status: None   Collection Time: 11/06/23 12:04 PM  Result Value Ref Range   Lipase 31 11 - 51 U/L    Comment: Performed at [facility], [address], [place], [place] [address]  Comprehensive metabolic panel     Status: Abnormal   Collection Time: 11/06/23 12:04 PM  Result Value Ref Range   Sodium 135 135 - 145 mmol/L   Potassium 3.7 3.5 - 5.1 mmol/L   Chloride 106 98 - 111 mmol/L   CO2 22 22 - 32 mmol/L   Glucose, Bld 97 70 - 99 mg/dL    Comment: Glucose reference range applies only to samples taken after fasting for at least 8 hours.   BUN 14 6 - 20 mg/dL   Creatinine, Ser 6.29 0.44 - 1.00 mg/dL   Calcium 9.6 8.9 - 52.8 mg/dL   Total Protein 7.4 6.5 - 8.1 g/dL   Albumin 4.5 3.5 - 5.0 g/dL   AST 4,132 (H) 15 - 41 U/L   ALT 1,208 (H) 0 - 44 U/L   Alkaline Phosphatase 96 38 - 126 U/L   Total Bilirubin 4.1 (H) 0.0 - 1.2 mg/dL   GFR, Estimated >44 >01 mL/min    Comment: (NOTE) Calculated using the CKD-EPI Creatinine Equation (2021)    Anion gap 7 5 - 15    Comment: Performed at [facility], [address] [place], [place] [address]  CBC     Status: None   Collection Time: 11/06/23 12:04 PM  Result Value Ref Range   WBC 4.4 4.0 - 10.5  K/uL   RBC 4.77 3.87 - 5.11 MIL/uL   Hemoglobin 14.5 12.0 - 15.0 g/dL   HCT 40.9 81.1 - 91.4 %   MCV 89.5 80.0 - 100.0 fL   MCH 30.4 26.0 - 34.0 pg   MCHC 34.0 30.0 - 36.0 g/dL   RDW 78.2 95.6 - 21.3 %   Platelets 166 150 - 400 K/uL   nRBC 0.0 0.0 - 0.2 %    Comment: Performed at [facility], [address], [place], [place] [address]  hCG, serum, qualitative     Status: None   Collection Time: 11/06/23 12:04 PM  Result Value Ref Range   Preg, Serum NEGATIVE NEGATIVE    Comment:        THE SENSITIVITY OF THIS METHODOLOGY IS >10 mIU/mL. Performed at [facility], [address] [place], [place] [address]   Hepatitis panel, acute     Status: None   Collection Time: 11/06/23 12:04 PM  Result Value Ref Range   Hepatitis B Surface Ag NON REACTIVE NON REACTIVE   HCV Ab NON REACTIVE NON REACTIVE     Comment: (NOTE) Nonreactive HCV antibody screen is consistent with no HCV infections,  unless recent infection is suspected or other evidence exists to indicate HCV infection.     Hep A IgM NON REACTIVE NON REACTIVE   Hep B C IgM NON REACTIVE NON REACTIVE    Comment: Performed at [facility], [address], [place], [place] [address]  Urinalysis, Routine w reflex microscopic -Urine, Clean Catch     Status: Abnormal   Collection Time: 11/06/23  7:45 PM  Result Value Ref Range   Color, Urine AMBER (A) YELLOW    Comment: BIOCHEMICALS MAY BE AFFECTED BY COLOR   APPearance HAZY (A) CLEAR   Specific Gravity, Urine 1.013 1.005 - 1.030   pH 6.0 5.0 - 8.0   Glucose, UA NEGATIVE NEGATIVE mg/dL   Hgb urine dipstick NEGATIVE NEGATIVE   Bilirubin Urine NEGATIVE NEGATIVE   Ketones, ur NEGATIVE NEGATIVE mg/dL   Protein, ur NEGATIVE NEGATIVE mg/dL   Nitrite POSITIVE (A) NEGATIVE   Leukocytes,Ua NEGATIVE NEGATIVE   RBC / HPF 0-5 0 - 5 RBC/hpf   WBC, UA 0-5 0 - 5 WBC/hpf   Bacteria, UA MANY (A) NONE SEEN   Squamous Epithelial / HPF 0-5 0 - 5 /HPF   Mucus PRESENT     Comment: Performed at [facility], [address], [place], [place] [address]  Lactic acid, plasma     Status: None   Collection Time: 11/06/23  7:49 PM  Result Value Ref Range   Lactic Acid, Venous 0.7 0.5 - 1.9 mmol/L    Comment: Performed at [facility], [address] [place], [place] [address]  TSH     Status: None   Collection Time: 11/06/23  7:49 PM  Result Value Ref Range   TSH 0.904 0.350 - 4.500 uIU/mL    Comment: Performed by a 3rd Generation assay with a functional sensitivity of <=0.01 uIU/mL. Performed at [facility], [address] [place], [place] [address]   HIV Antibody (routine testing w rflx)     Status: None   Collection Time: 11/06/23  7:49 PM  Result Value Ref Range   HIV Screen 4th Generation wRfx Non Reactive Non Reactive     Comment: Performed at [facility], [address] [place], [place] [address]  Protime-INR  Status: None   Collection Time: 11/06/23  7:49 PM  Result Value Ref Range   Prothrombin Time 15.2 11.4 - 15.2 seconds   INR 1.2 0.8 - 1.2    Comment: (NOTE) INR goal varies based on device and disease states. Performed at [facility], [address], [place], [place] [address]   Lactic acid, plasma     Status: None   Collection Time: 11/06/23 10:04 PM  Result Value Ref Range   Lactic Acid, Venous 1.0 0.5 - 1.9 mmol/L    Comment: Performed at [facility], [address], [place], [place] [address]  Comprehensive metabolic panel     Status: Abnormal   Collection Time: 11/07/23  5:29 AM  Result Value Ref Range   Sodium 137 135 - 145 mmol/L   Potassium 3.4 (L) 3.5 - 5.1 mmol/L   Chloride 111 98 - 111 mmol/L   CO2 20 (L) 22 - 32 mmol/L   Glucose, Bld 83 70 - 99 mg/dL    Comment: Glucose reference range applies only to samples taken after fasting for at least 8 hours.   BUN 11 6 - 20 mg/dL   Creatinine, Ser 0.98 0.44 - 1.00 mg/dL   Calcium 8.3 (L) 8.9 - 10.3 mg/dL   Total Protein 5.4 (L) 6.5 - 8.1 g/dL   Albumin 3.1 (L) 3.5 - 5.0 g/dL   AST 119 (H) 15 - 41 U/L   ALT 683 (H) 0 - 44 U/L   Alkaline Phosphatase 82 38 - 126 U/L   Total Bilirubin 5.1 (H) 0.0 - 1.2 mg/dL   GFR, Estimated >14 >78 mL/min    Comment: (NOTE) Calculated using the CKD-EPI Creatinine Equation (2021)    Anion gap 6 5 - 15    Comment: Performed at [facility], [address] [place], [place] [address]  CBC     Status: Abnormal   Collection Time: 11/07/23  5:29 AM  Result Value Ref Range   WBC 3.7 (L) 4.0 - 10.5 K/uL   RBC 3.67 (L) 3.87 - 5.11 MIL/uL   Hemoglobin 11.3 (L) 12.0 - 15.0 g/dL   HCT 13.0 (L) 86.5 - 78.4 %   MCV 90.5 80.0 - 100.0 fL   MCH 30.8 26.0 - 34.0 pg   MCHC 34.0 30.0 - 36.0 g/dL   RDW 69.6 29.5 - 28.4 %   Platelets 138 (L) 150  - 400 K/uL   nRBC 0.0 0.0 - 0.2 %    Comment: Performed at [facility], [address], [place], [place] [address]    MR ABDOMEN MRCP W WO CONTAST Result Date: 11/07/2023 CLINICAL DATA:  Abdominal pain with cholelithiasis on ultrasound examination EXAM: MRI ABDOMEN WITHOUT AND WITH CONTRAST (INCLUDING MRCP) TECHNIQUE: Multiplanar multisequence MR imaging of the abdomen was performed both before and after the administration of intravenous contrast. Heavily T2-weighted images of the biliary and pancreatic ducts were obtained. Post-processing was applied at the acquisition scanner with concurrent physician supervision which includes 3D reconstructions, MIPs, volume rendered images and/or shaded surface rendering. CONTRAST:  6mL GADAVIST GADOBUTROL 1 MMOL/ML IV SOLN COMPARISON:  Earlier same day right upper quadrant ultrasound examination FINDINGS: Lower chest: No acute findings. Hepatobiliary: No mass or other parenchymal abnormality identified. Common bile duct measures 7 mm, at the upper limits of normal with at least 2 filling defects in the downstream duct measuring up to 2 mm (3:14). Distended gallbladder with cholelithiasis. Interval development of trace pericholecystic free fluid  and mild periportal edema. Pancreas: No mass, inflammatory changes, or other parenchymal abnormality identified. Spleen:  Splenomegaly measures 14.3 cm. Adrenals/Urinary Tract: No adrenal nodules. No suspicious renal masses identified. No evidence of hydronephrosis. Stomach/Bowel: Visualized portions within the abdomen are unremarkable. Vascular/Lymphatic: No pathologically enlarged lymph nodes identified. No abdominal aortic aneurysm demonstrated. Other:  None. Musculoskeletal: No suspicious bone lesions identified. IMPRESSION: 1. Choledocholithiasis with punctate, 2 mm stones in the downstream duct. 2. Distended gallbladder with cholelithiasis. Interval development of trace pericholecystic free fluid and mild  periportal edema, likely reactive or related to fluid resuscitation. 3. Splenomegaly measures 14.3 cm. Electronically Signed   By: Agustin Cree M.D.   On: 11/07/2023 08:36   MR 3D Recon At Scanner Result Date: 11/07/2023 CLINICAL DATA:  Abdominal pain with cholelithiasis on ultrasound examination EXAM: MRI ABDOMEN WITHOUT AND WITH CONTRAST (INCLUDING MRCP) TECHNIQUE: Multiplanar multisequence MR imaging of the abdomen was performed both before and after the administration of intravenous contrast. Heavily T2-weighted images of the biliary and pancreatic ducts were obtained. Post-processing was applied at the acquisition scanner with concurrent physician supervision which includes 3D reconstructions, MIPs, volume rendered images and/or shaded surface rendering. CONTRAST:  6mL GADAVIST GADOBUTROL 1 MMOL/ML IV SOLN COMPARISON:  Earlier same day right upper quadrant ultrasound examination FINDINGS: Lower chest: No acute findings. Hepatobiliary: No mass or other parenchymal abnormality identified. Common bile duct measures 7 mm, at the upper limits of normal with at least 2 filling defects in the downstream duct measuring up to 2 mm (3:14). Distended gallbladder with cholelithiasis. Interval development of trace pericholecystic free fluid and mild periportal edema. Pancreas: No mass, inflammatory changes, or other parenchymal abnormality identified. Spleen:  Splenomegaly measures 14.3 cm. Adrenals/Urinary Tract: No adrenal nodules. No suspicious renal masses identified. No evidence of hydronephrosis. Stomach/Bowel: Visualized portions within the abdomen are unremarkable. Vascular/Lymphatic: No pathologically enlarged lymph nodes identified. No abdominal aortic aneurysm demonstrated. Other:  None. Musculoskeletal: No suspicious bone lesions identified. IMPRESSION: 1. Choledocholithiasis with punctate, 2 mm stones in the downstream duct. 2. Distended gallbladder with cholelithiasis. Interval development of trace  pericholecystic free fluid and mild periportal edema, likely reactive or related to fluid resuscitation. 3. Splenomegaly measures 14.3 cm. Electronically Signed   By: Agustin Cree M.D.   On: 11/07/2023 08:36   US Abdomen Limited RUQ (LIVER/GB) Result Date: 11/06/2023 CLINICAL DATA:  One day history of abdominal pain EXAM: ULTRASOUND ABDOMEN LIMITED RIGHT UPPER QUADRANT COMPARISON:  None Available. FINDINGS: Gallbladder: Innumerable gallstones measuring up to 8 mm. No wall thickening visualized. No sonographic Murphy sign noted by sonographer. Common bile duct: Diameter: 3 mm Liver: No focal lesion identified. Within normal limits in parenchymal echogenicity. Portal vein is patent on color Doppler imaging with normal direction of blood flow towards the liver. Other: None. IMPRESSION: Cholelithiasis without sonographic evidence of acute cholecystitis. Electronically Signed   By: Agustin Cree M.D.   On: 11/06/2023 15:39    Review of Systems  Constitutional:  Negative for chills and fever.  Respiratory:  Negative for cough and shortness of breath.   Cardiovascular:  Negative for chest pain.  Gastrointestinal:  Negative for blood in stool.  Genitourinary:  Negative for dysuria.   Blood pressure (!) 103/54, pulse (!) 59, temperature 98.7 F (37.1 C), temperature source Oral, resp. rate 16, height 5\' 7"  (1.702 m), weight 59 kg, last menstrual period 10/14/2023, SpO2 99%. Physical Exam Constitutional:      Appearance: She is well-developed and normal weight. She is not ill-appearing.  HENT:  Head: Normocephalic and atraumatic.  Eyes:     General: Scleral icterus present.  Cardiovascular:     Rate and Rhythm: Normal rate and regular rhythm.  Pulmonary:     Effort: Pulmonary effort is normal. No respiratory distress.  Abdominal:     Comments: Her abdomen is soft and nondistended.  There is tenderness with guarding which is mild to moderate in the right upper quadrant  Skin:    General: Skin is warm  and dry.  Neurological:     General: No focal deficit present.     Mental Status: She is alert.  Psychiatric:        Mood and Affect: Mood normal.        Behavior: Behavior normal.     Assessment/Plan: Cholecystitis with cholelithiasis and choledocholithiasis  I have reviewed her MRCP and laboratory data.  I discussed the diagnosis with her and family in the room.  She is scheduled for an ERCP tomorrow to remove the bile duct stone.  We would then recommend proceeding to the operating room following this likely on Tuesday with a laparoscopic cholecystectomy.  We discussed the reasons to remove the gallbladder as she does have stones in the gallbladder and there may be some cholecystitis as well.  This would prevent ongoing attacks in future problems.  I briefly explained the surgical procedure to her.  Currently, she understands and agrees with plans.  We will follow closely with you  Moderately complex medical decision making    Abigail Miyamoto MD 11/07/2023, 2:56 PM

## 2023-11-07 NOTE — H&P (View-Only) (Signed)
 Referring Provider: [facility] Primary Care Physician:  [facility] Primary Gastroenterologist: Gentry Fitz  Reason for Consultation: Pain, LFTs  HPI: Olivia James is a 48 y.o. female with no significant past medical history presented to the hospital with acute abdominal pain.  Blood work during initial evaluation in the emergency department showed significant elevated AST of 12-18, ALT of 1208, T. bili of 4.1 and normal alkaline phosphatase.  Normal CBC.  Normal lipase.  Normal INR.  Negative hepatitis panel.  Ultrasound abdomen right upper quadrant showed gallstones without any evidence of acute cholecystitis.  CBD of 3 mm.  GI was contacted by emergency department.  MRI was recommended which showed 2 mm stone in the distal CBD with mild dilation of CBD of 7 mm.  Patient seen and examined at bedside.  She had episodes of nausea and vomiting 2 days ago which was followed by acute episode of abdominal pain.  Denies any diarrhea or constipation.  Denies similar symptoms in the past.  Symptoms improved significantly since hospitalization.  No past medical history on file.  Past Surgical History:  Procedure Laterality Date   ARTHROSCOPY WITH ANTERIOR CRUCIATE LIGAMENT (ACL) REPAIR WITH ANTERIOR TIBILIAS GRAFT Right    c section     TUBAL LIGATION     WISDOM TOOTH EXTRACTION      Prior to Admission medications   Medication Sig Start Date End Date Taking? Authorizing Provider  MOTRIN IB 200 MG CAPS Take 200-800 mg by mouth every 6 (six) hours as needed (for pain or headaches).   Yes [provider]  NURTEC 75 MG TBDP Take 75 mg by mouth daily as needed (for migraine- dissolve orally- max of 75 mg/24 hours).   Yes [provider]  topiramate (TOPAMAX) 50 MG tablet Take 50 mg by mouth at bedtime. 10/25/23  Yes [provider]  tranexamic acid (LYSTEDA) 650 MG TABS tablet Take 2 tablets (1,300 mg total) by mouth 3 (three) times daily. Take during menses for a  maximum of five days 07/01/23  Yes Sue Lush, FNP  Vitamin D, Ergocalciferol, (DRISDOL) 1.25 MG (50000 UNIT) CAPS capsule Take 50,000 Units by mouth every Wednesday.   Yes [provider]  ACCRUFER 30 MG CAPS Take 30 mg by mouth 2 (two) times daily. Patient not taking: Reported on 11/06/2023 10/25/23   [provider]    Scheduled Meds:  enoxaparin (LOVENOX) injection  40 mg Subcutaneous Q24H   Continuous Infusions:  cefTRIAXone (ROCEPHIN)  IV 1 g (11/07/23 0859)   PRN Meds:.acetaminophen **OR** acetaminophen, HYDROmorphone (DILAUDID) injection, lip balm, LORazepam, ondansetron **OR** ondansetron (ZOFRAN) IV, mouth rinse, oxyCODONE  Allergies as of 11/06/2023 - Review Complete 11/06/2023  Allergen Reaction Noted   Latex Rash 11/06/2023    No family history on file.  Social History   Socioeconomic History   Marital status: Divorced    Spouse name: Not on file   Number of children: Not on file   Years of education: Not on file   Highest education level: Not on file  Occupational History   Not on file  Tobacco Use   Smoking status: Former    Current packs/day: 1.00    Types: Cigarettes   Smokeless tobacco: Never  Vaping Use   Vaping status: Every Day  Substance and Sexual Activity   Alcohol use: Not Currently   Drug use: Never   Sexual activity: Yes    Partners: Male    Birth control/protection: Surgical  Other Topics Concern  Not on file  Social History Narrative   Not on file   Social Drivers of Health   Financial Resource Strain: Not on file  Food Insecurity: No Food Insecurity (11/06/2023)   Hunger Vital Sign    Worried About Running Out of Food in the Last Year: Never true    Ran Out of Food in the Last Year: Never true  Transportation Needs: No Transportation Needs (11/06/2023)   PRAPARE - Administrator, Civil Service (Medical): No    Lack of Transportation (Non-Medical): No  Physical Activity: Not on file  Stress: Not  on file  Social Connections: Socially Isolated (11/06/2023)   Social Connection and Isolation Panel [NHANES]    Frequency of Communication with Friends and Family: More than three times a week    Frequency of Social Gatherings with Friends and Family: More than three times a week    Attends Religious Services: Never    Database administrator or Organizations: No    Attends Banker Meetings: Never    Marital Status: Divorced  Catering manager Violence: Not At Risk (11/06/2023)   Humiliation, Afraid, Rape, and Kick questionnaire    Fear of Current or Ex-Partner: No    Emotionally Abused: No    Physically Abused: No    Sexually Abused: No    Review of Systems: All negative except as stated above in HPI.  Physical Exam: Vital signs: Vitals:   11/07/23 0418 11/07/23 0730  BP: (!) 95/49 (!) 104/58  Pulse:  (!) 55  Resp: 18   Temp: 98.6 F (37 C) 98.7 F (37.1 C)  SpO2: 97% 99%   Last BM Date : 11/05/23 Physical Exam Constitutional:      General: She is not in acute distress.    Appearance: She is well-developed.  HENT:     Head: Normocephalic and atraumatic.  Eyes:     Extraocular Movements: Extraocular movements intact.  Cardiovascular:     Rate and Rhythm: Normal rate and regular rhythm.     Heart sounds: Normal heart sounds. No murmur heard. Pulmonary:     Effort: Pulmonary effort is normal. No respiratory distress.  Abdominal:     General: Bowel sounds are normal. There is no distension.     Palpations: Abdomen is soft.     Tenderness: There is no abdominal tenderness.     Hernia: No hernia is present.  Skin:    General: Skin is warm.     Coloration: Skin is not jaundiced.  Neurological:     General: No focal deficit present.     Mental Status: She is alert.  Psychiatric:        Mood and Affect: Mood normal. Mood is not anxious or depressed.        Behavior: Behavior normal.      GI:  Lab Results: Recent Labs    11/06/23 1204 11/07/23 0529   WBC 4.4 3.7*  HGB 14.5 11.3*  HCT 42.7 33.2*  PLT 166 138*   BMET Recent Labs    11/06/23 1204 11/07/23 0529  NA 135 137  K 3.7 3.4*  CL 106 111  CO2 22 20*  GLUCOSE 97 83  BUN 14 11  CREATININE 0.51 0.54  CALCIUM 9.6 8.3*   LFT Recent Labs    11/07/23 0529  PROT 5.4*  ALBUMIN 3.1*  AST 430*  ALT 683*  ALKPHOS 82  BILITOT 5.1*   PT/INR Recent Labs    11/06/23 1949  LABPROT 15.2  INR 1.2     Studies/Results: MR ABDOMEN MRCP W WO CONTAST Result Date: 11/07/2023 CLINICAL DATA:  Abdominal pain with cholelithiasis on ultrasound examination EXAM: MRI ABDOMEN WITHOUT AND WITH CONTRAST (INCLUDING MRCP) TECHNIQUE: Multiplanar multisequence MR imaging of the abdomen was performed both before and after the administration of intravenous contrast. Heavily T2-weighted images of the biliary and pancreatic ducts were obtained. Post-processing was applied at the acquisition scanner with concurrent physician supervision which includes 3D reconstructions, MIPs, volume rendered images and/or shaded surface rendering. CONTRAST:  6mL GADAVIST GADOBUTROL 1 MMOL/ML IV SOLN COMPARISON:  Earlier same day right upper quadrant ultrasound examination FINDINGS: Lower chest: No acute findings. Hepatobiliary: No mass or other parenchymal abnormality identified. Common bile duct measures 7 mm, at the upper limits of normal with at least 2 filling defects in the downstream duct measuring up to 2 mm (3:14). Distended gallbladder with cholelithiasis. Interval development of trace pericholecystic free fluid and mild periportal edema. Pancreas: No mass, inflammatory changes, or other parenchymal abnormality identified. Spleen:  Splenomegaly measures 14.3 cm. Adrenals/Urinary Tract: No adrenal nodules. No suspicious renal masses identified. No evidence of hydronephrosis. Stomach/Bowel: Visualized portions within the abdomen are unremarkable. Vascular/Lymphatic: No pathologically enlarged lymph nodes identified.  No abdominal aortic aneurysm demonstrated. Other:  None. Musculoskeletal: No suspicious bone lesions identified. IMPRESSION: 1. Choledocholithiasis with punctate, 2 mm stones in the downstream duct. 2. Distended gallbladder with cholelithiasis. Interval development of trace pericholecystic free fluid and mild periportal edema, likely reactive or related to fluid resuscitation. 3. Splenomegaly measures 14.3 cm. Electronically Signed   By: Agustin Cree M.D.   On: 11/07/2023 08:36   MR 3D Recon At Scanner Result Date: 11/07/2023 CLINICAL DATA:  Abdominal pain with cholelithiasis on ultrasound examination EXAM: MRI ABDOMEN WITHOUT AND WITH CONTRAST (INCLUDING MRCP) TECHNIQUE: Multiplanar multisequence MR imaging of the abdomen was performed both before and after the administration of intravenous contrast. Heavily T2-weighted images of the biliary and pancreatic ducts were obtained. Post-processing was applied at the acquisition scanner with concurrent physician supervision which includes 3D reconstructions, MIPs, volume rendered images and/or shaded surface rendering. CONTRAST:  6mL GADAVIST GADOBUTROL 1 MMOL/ML IV SOLN COMPARISON:  Earlier same day right upper quadrant ultrasound examination FINDINGS: Lower chest: No acute findings. Hepatobiliary: No mass or other parenchymal abnormality identified. Common bile duct measures 7 mm, at the upper limits of normal with at least 2 filling defects in the downstream duct measuring up to 2 mm (3:14). Distended gallbladder with cholelithiasis. Interval development of trace pericholecystic free fluid and mild periportal edema. Pancreas: No mass, inflammatory changes, or other parenchymal abnormality identified. Spleen:  Splenomegaly measures 14.3 cm. Adrenals/Urinary Tract: No adrenal nodules. No suspicious renal masses identified. No evidence of hydronephrosis. Stomach/Bowel: Visualized portions within the abdomen are unremarkable. Vascular/Lymphatic: No pathologically enlarged  lymph nodes identified. No abdominal aortic aneurysm demonstrated. Other:  None. Musculoskeletal: No suspicious bone lesions identified. IMPRESSION: 1. Choledocholithiasis with punctate, 2 mm stones in the downstream duct. 2. Distended gallbladder with cholelithiasis. Interval development of trace pericholecystic free fluid and mild periportal edema, likely reactive or related to fluid resuscitation. 3. Splenomegaly measures 14.3 cm. Electronically Signed   By: Agustin Cree M.D.   On: 11/07/2023 08:36   US Abdomen Limited RUQ (LIVER/GB) Result Date: 11/06/2023 CLINICAL DATA:  One day history of abdominal pain EXAM: ULTRASOUND ABDOMEN LIMITED RIGHT UPPER QUADRANT COMPARISON:  None Available. FINDINGS: Gallbladder: Innumerable gallstones measuring up to 8 mm. No wall thickening visualized. No sonographic Murphy sign  noted by sonographer. Common bile duct: Diameter: 3 mm Liver: No focal lesion identified. Within normal limits in parenchymal echogenicity. Portal vein is patent on color Doppler imaging with normal direction of blood flow towards the liver. Other: None. IMPRESSION: Cholelithiasis without sonographic evidence of acute cholecystitis. Electronically Signed   By: Agustin Cree M.D.   On: 11/06/2023 15:39    Impression/Plan: -Choledocholithiasis with MRI confirming 2 mm stone in the CBD. -Cholelithiasis.  Ultrasound showing multiple gallbladder stones. -Acute onset of abdominal pain, nausea and vomiting.  Likely from choledocholithiasis.  Improving. -Abnormal LFTs.  Improving.  Recommendations ------------------------- -Plan for ERCP tomorrow with Dr. Elnoria Howard. -Recommend surgical evaluation for cholecystectomy -Okay to have full liquid diet today.  Keep n.p.o. past midnight. -Repeat labs in the morning. -GI will follow  Risks (post ERCP pancreatitis, bleeding, infection, bowel perforation that could require surgery, sedation-related changes in cardiopulmonary systems), benefits (identification and  possible treatment of source of symptoms, exclusion of certain causes of symptoms), and alternatives (watchful waiting, radiographic imaging studies, empiric medical treatment)  were explained to patient/family in detail and patient wishes to proceed.     LOS: 0 days   Kathi Der  MD, FACP 11/07/2023, 9:33 AM  Contact #  [phone]

## 2023-11-08 ENCOUNTER — Encounter (HOSPITAL_COMMUNITY): Payer: Self-pay | Admitting: Internal Medicine

## 2023-11-08 ENCOUNTER — Encounter (HOSPITAL_COMMUNITY)
Admission: EM | Disposition: A | Discharge status: Home / Self Care | Payer: Self-pay | Source: Home / Self Care | Attending: Internal Medicine

## 2023-11-08 ENCOUNTER — Inpatient Hospital Stay (HOSPITAL_COMMUNITY)

## 2023-11-08 ENCOUNTER — Inpatient Hospital Stay (HOSPITAL_COMMUNITY): Admitting: Certified Registered Nurse Anesthetist

## 2023-11-08 DIAGNOSIS — R1011 Right upper quadrant pain: Secondary | ICD-10-CM | POA: Diagnosis not present

## 2023-11-08 DIAGNOSIS — K8080 Other cholelithiasis without obstruction: Secondary | ICD-10-CM | POA: Diagnosis not present

## 2023-11-08 DIAGNOSIS — K8050 Calculus of bile duct without cholangitis or cholecystitis without obstruction: Secondary | ICD-10-CM | POA: Diagnosis not present

## 2023-11-08 DIAGNOSIS — K8043 Calculus of bile duct with acute cholecystitis with obstruction: Secondary | ICD-10-CM | POA: Diagnosis not present

## 2023-11-08 HISTORY — PX: STONE EXTRACTION WITH BASKET: SHX5318

## 2023-11-08 HISTORY — PX: ERCP: SHX5425

## 2023-11-08 HISTORY — PX: SPHINCTEROTOMY: SHX5279

## 2023-11-08 LAB — CBC WITH DIFFERENTIAL/PLATELET
Abs Immature Granulocytes: 0.01 10*3/uL (ref 0.00–0.07)
Basophils Absolute: 0 10*3/uL (ref 0.0–0.1)
Basophils Relative: 1 %
Eosinophils Absolute: 0.1 10*3/uL (ref 0.0–0.5)
Eosinophils Relative: 2 %
HCT: 34.8 % — ABNORMAL LOW (ref 36.0–46.0)
Hemoglobin: 11.8 g/dL — ABNORMAL LOW (ref 12.0–15.0)
Immature Granulocytes: 0 %
Lymphocytes Relative: 39 %
Lymphs Abs: 1.5 10*3/uL (ref 0.7–4.0)
MCH: 31.1 pg (ref 26.0–34.0)
MCHC: 33.9 g/dL (ref 30.0–36.0)
MCV: 91.6 fL (ref 80.0–100.0)
Monocytes Absolute: 0.3 10*3/uL (ref 0.1–1.0)
Monocytes Relative: 8 %
Neutro Abs: 1.9 10*3/uL (ref 1.7–7.7)
Neutrophils Relative %: 50 %
Platelets: 137 10*3/uL — ABNORMAL LOW (ref 150–400)
RBC: 3.8 MIL/uL — ABNORMAL LOW (ref 3.87–5.11)
RDW: 13.2 % (ref 11.5–15.5)
WBC: 3.8 10*3/uL — ABNORMAL LOW (ref 4.0–10.5)
nRBC: 0 % (ref 0.0–0.2)

## 2023-11-08 LAB — BASIC METABOLIC PANEL
Anion gap: 7 (ref 5–15)
BUN: 6 mg/dL (ref 6–20)
CO2: 19 mmol/L — ABNORMAL LOW (ref 22–32)
Calcium: 8.7 mg/dL — ABNORMAL LOW (ref 8.9–10.3)
Chloride: 113 mmol/L — ABNORMAL HIGH (ref 98–111)
Creatinine, Ser: 0.6 mg/dL (ref 0.44–1.00)
GFR, Estimated: >60 mL/min (ref >60)
Glucose, Bld: 92 mg/dL (ref 70–99)
Potassium: 3.6 mmol/L (ref 3.5–5.1)
Sodium: 139 mmol/L (ref 135–145)

## 2023-11-08 LAB — HEPATIC FUNCTION PANEL
ALT: 600 U/L — ABNORMAL HIGH (ref 0–44)
AST: 310 U/L — ABNORMAL HIGH (ref 15–41)
Albumin: 3.2 g/dL — ABNORMAL LOW (ref 3.5–5.0)
Alkaline Phosphatase: 88 U/L (ref 38–126)
Bilirubin, Direct: 2.7 mg/dL — ABNORMAL HIGH (ref 0.0–0.2)
Indirect Bilirubin: 2 mg/dL — ABNORMAL HIGH (ref 0.3–0.9)
Total Bilirubin: 4.7 mg/dL — ABNORMAL HIGH (ref 0.0–1.2)
Total Protein: 5.7 g/dL — ABNORMAL LOW (ref 6.5–8.1)

## 2023-11-08 SURGERY — ERCP, WITH INTERVENTION IF INDICATED
Anesthesia: General

## 2023-11-08 MED ORDER — ONDANSETRON HCL 4 MG/2ML IJ SOLN
INTRAMUSCULAR | Status: DC | PRN
  Administered 2023-11-08: 4 mg via INTRAVENOUS

## 2023-11-08 MED ORDER — PROPOFOL 10 MG/ML IV BOLUS
INTRAVENOUS | Status: DC | PRN
  Administered 2023-11-08: 200 mg via INTRAVENOUS

## 2023-11-08 MED ORDER — FENTANYL CITRATE (PF) 100 MCG/2ML IJ SOLN
INTRAMUSCULAR | Status: DC | PRN
  Administered 2023-11-08 (×2): 50 ug via INTRAVENOUS

## 2023-11-08 MED ORDER — DICLOFENAC SUPPOSITORY 100 MG
RECTAL | Status: AC
  Filled 2023-11-08: qty 1

## 2023-11-08 MED ORDER — MIDAZOLAM HCL 2 MG/2ML IJ SOLN
INTRAMUSCULAR | Status: AC
  Filled 2023-11-08: qty 2

## 2023-11-08 MED ORDER — LACTATED RINGERS IV SOLN: INTRAVENOUS | Status: DC | PRN

## 2023-11-08 MED ORDER — DEXAMETHASONE SODIUM PHOSPHATE 10 MG/ML IJ SOLN
INTRAMUSCULAR | Status: DC | PRN
  Administered 2023-11-08: 10 mg via INTRAVENOUS

## 2023-11-08 MED ORDER — GLUCAGON HCL RDNA (DIAGNOSTIC) 1 MG IJ SOLR
INTRAMUSCULAR | Status: AC
  Filled 2023-11-08: qty 1

## 2023-11-08 MED ORDER — LIDOCAINE 2% (20 MG/ML) 5 ML SYRINGE
INTRAMUSCULAR | Status: DC | PRN
  Administered 2023-11-08: 100 mg via INTRAVENOUS

## 2023-11-08 MED ORDER — SUCCINYLCHOLINE CHLORIDE 200 MG/10ML IV SOSY
PREFILLED_SYRINGE | INTRAVENOUS | Status: DC | PRN
  Administered 2023-11-08: 80 mg via INTRAVENOUS

## 2023-11-08 MED ORDER — SUGAMMADEX SODIUM 200 MG/2ML IV SOLN
INTRAVENOUS | Status: DC | PRN
Start: 2023-11-08 — End: 2023-11-08
  Administered 2023-11-08: 200 mg via INTRAVENOUS

## 2023-11-08 MED ORDER — FENTANYL CITRATE (PF) 100 MCG/2ML IJ SOLN
INTRAMUSCULAR | Status: AC
  Filled 2023-11-08: qty 2

## 2023-11-08 MED ORDER — ROCURONIUM BROMIDE 10 MG/ML (PF) SYRINGE
PREFILLED_SYRINGE | INTRAVENOUS | Status: DC | PRN
  Administered 2023-11-08: 50 mg via INTRAVENOUS

## 2023-11-08 MED ORDER — MIDAZOLAM HCL 5 MG/5ML IJ SOLN
INTRAMUSCULAR | Status: DC | PRN
  Administered 2023-11-08: 2 mg via INTRAVENOUS

## 2023-11-08 MED ORDER — DICLOFENAC SUPPOSITORY 100 MG
RECTAL | Status: DC | PRN
  Administered 2023-11-08: 100 mg via RECTAL

## 2023-11-08 MED ORDER — SODIUM CHLORIDE 0.9 % IV SOLN
INTRAVENOUS | Status: DC | PRN
  Administered 2023-11-08: 20 mL

## 2023-11-08 NOTE — Progress Notes (Signed)
 [facility]                                                                               Olivia James, is a 48 y.o. female, DOB - 06/17/1976, FAO:[id number] Admit date - 11/06/2023    Outpatient Primary MD for the patient is [facility]  LOS - 1  days    Brief summary   Olivia James is a 48 y.o. female with no pertinent past medical history presenting to the ED with RUQ pain.   Assessment & Plan    Assessment and Plan:  RUQ PAIN associated with nausea and vomiting.  Choledocholithiasis,  stone in the distal CBD with dilatation of the CBD duct on MRCP GI on board, plan for ERCP today.  Pain control   Continue with IV fluids and symptomatic management with anti emetics.  Gen surgery consulted, plan for lap cholecystectomy in am. She remains afebrile, normal wbc count.  Lipase wnl.  Elevated liver enzymes on admission, slowly improving.  T bili elevated at 5.1.  Continue to monitor.  LA is wnl.  Acute hepatitis panel is negative.    Abnormal UA, foul smelling urine. And some abdominal discomfort.  Get urine cultures.  Continue with Ceftriaxone.    Mild thrombocytopenia Platelets of 138,000. Monitor.   Hypokalemia Replaced.  Magnesium level wnl.   Estimated body mass index is 20.36 kg/m as calculated from the following:   Height as of this encounter: 5\' 7"  (1.702 m).   Weight as of this encounter: 59 kg.  Code Status: full code.  DVT Prophylaxis:  enoxaparin (LOVENOX) injection 40 mg Start: 11/06/23 2200   Level of Care: Level of care: Med-Surg Family Communication: Updated patient's family at bedside.   Disposition Plan:     Remains inpatient appropriate:  ERCP in am  Procedures:  MRCP  Consultants:   GI General surgery.   Antimicrobials:   Anti-infectives (From admission, onward)    Start     Dose/Rate Route Frequency Ordered Stop   11/07/23 0900  [MAR Hold]  cefTRIAXone (ROCEPHIN) 1 g in sodium chloride 0.9 %  100 mL IVPB        (MAR Hold since Mon 11/08/2023 at 1026.Hold Reason: Transfer to a Procedural area)   1 g 200 mL/hr over 30 Minutes Intravenous Every 24 hours 11/07/23 0742          Medications  Scheduled Meds:  [MAR Hold] enoxaparin (LOVENOX) injection  40 mg Subcutaneous Q24H   Continuous Infusions:  [MAR Hold] cefTRIAXone (ROCEPHIN)  IV 1 g (11/08/23 0920)   PRN Meds:.[MAR Hold] acetaminophen **OR** [MAR Hold] acetaminophen, [MAR Hold]  HYDROmorphone (DILAUDID) injection, [MAR Hold] lip balm, [MAR Hold] LORazepam, [MAR Hold] ondansetron **OR** [MAR Hold] ondansetron (ZOFRAN) IV, [MAR Hold] mouth rinse, [MAR Hold] oxyCODONE    Subjective:   Xylah Early was seen and examined today. No new complaints today.   Objective:   Vitals:   11/07/23 1244 11/07/23 2214 11/08/23 0441 11/08/23 1038  BP: (!) 103/54 (!) 107/59 (!) 103/56 127/69  Pulse: (!) 59 (!) 58 (!) 48 (!) 55  Resp: 16 19 18 10   Temp: 98.7 F (37.1 C) 98.4  F (36.9 C) 98.7 F (37.1 C) 98.7 F (37.1 C)  TempSrc: Oral Oral Oral Temporal  SpO2: 99% 98% 97% 97%  Weight:      Height:        Intake/Output Summary (Last 24 hours) at 11/08/2023 1203 Last data filed at 11/08/2023 0300 Gross per 24 hour  Intake 1534.41 ml  Output 650 ml  Net 884.41 ml   Filed Weights   11/06/23 1151  Weight: 59 kg     Exam  General exam: Appears calm and comfortable  Respiratory system: Clear to auscultation. Respiratory effort normal. Cardiovascular system: S1 & S2 heard, RRR. No JVD, Gastrointestinal system: Abdomen is soft bs+ Central nervous system: Alert and oriented.  Extremities: Symmetric 5 x 5 power. Skin: No rashes, Psychiatry:  Mood & affect appropriate.    Data Reviewed:  I have personally reviewed following labs and imaging studies   CBC Lab Results  Component Value Date   WBC 3.8 (L) 11/08/2023   RBC 3.80 (L) 11/08/2023   HGB 11.8 (L) 11/08/2023   HCT 34.8 (L) 11/08/2023   MCV 91.6  11/08/2023   MCH 31.1 11/08/2023   PLT 137 (L) 11/08/2023   MCHC 33.9 11/08/2023   RDW 13.2 11/08/2023   LYMPHSABS 1.5 11/08/2023   MONOABS 0.3 11/08/2023   EOSABS 0.1 11/08/2023   BASOSABS 0.0 11/08/2023     Last metabolic panel Lab Results  Component Value Date   NA 139 11/08/2023   K 3.6 11/08/2023   CL 113 (H) 11/08/2023   CO2 19 (L) 11/08/2023   BUN 6 11/08/2023   CREATININE 0.60 11/08/2023   GLUCOSE 92 11/08/2023   GFRNONAA >60 11/08/2023   CALCIUM 8.7 (L) 11/08/2023   PROT 5.7 (L) 11/08/2023   ALBUMIN 3.2 (L) 11/08/2023   BILITOT 4.7 (H) 11/08/2023   ALKPHOS 88 11/08/2023   AST 310 (H) 11/08/2023   ALT 600 (H) 11/08/2023   ANIONGAP 7 11/08/2023    CBG (last 3)  No results for input(s): "GLUCAP" in the last 72 hours.    Coagulation Profile: Recent Labs  Lab 11/06/23 1949  INR 1.2     Radiology Studies: MR ABDOMEN MRCP W WO CONTAST Result Date: 11/07/2023 CLINICAL DATA:  Abdominal pain with cholelithiasis on ultrasound examination EXAM: MRI ABDOMEN WITHOUT AND WITH CONTRAST (INCLUDING MRCP) TECHNIQUE: Multiplanar multisequence MR imaging of the abdomen was performed both before and after the administration of intravenous contrast. Heavily T2-weighted images of the biliary and pancreatic ducts were obtained. Post-processing was applied at the acquisition scanner with concurrent physician supervision which includes 3D reconstructions, MIPs, volume rendered images and/or shaded surface rendering. CONTRAST:  6mL GADAVIST GADOBUTROL 1 MMOL/ML IV SOLN COMPARISON:  Earlier same day right upper quadrant ultrasound examination FINDINGS: Lower chest: No acute findings. Hepatobiliary: No mass or other parenchymal abnormality identified. Common bile duct measures 7 mm, at the upper limits of normal with at least 2 filling defects in the downstream duct measuring up to 2 mm (3:14). Distended gallbladder with cholelithiasis. Interval development of trace pericholecystic free  fluid and mild periportal edema. Pancreas: No mass, inflammatory changes, or other parenchymal abnormality identified. Spleen:  Splenomegaly measures 14.3 cm. Adrenals/Urinary Tract: No adrenal nodules. No suspicious renal masses identified. No evidence of hydronephrosis. Stomach/Bowel: Visualized portions within the abdomen are unremarkable. Vascular/Lymphatic: No pathologically enlarged lymph nodes identified. No abdominal aortic aneurysm demonstrated. Other:  None. Musculoskeletal: No suspicious bone lesions identified. IMPRESSION: 1. Choledocholithiasis with punctate, 2 mm stones in the downstream duct.  2. Distended gallbladder with cholelithiasis. Interval development of trace pericholecystic free fluid and mild periportal edema, likely reactive or related to fluid resuscitation. 3. Splenomegaly measures 14.3 cm. Electronically Signed   By: Agustin Cree M.D.   On: 11/07/2023 08:36   MR 3D Recon At Scanner Result Date: 11/07/2023 CLINICAL DATA:  Abdominal pain with cholelithiasis on ultrasound examination EXAM: MRI ABDOMEN WITHOUT AND WITH CONTRAST (INCLUDING MRCP) TECHNIQUE: Multiplanar multisequence MR imaging of the abdomen was performed both before and after the administration of intravenous contrast. Heavily T2-weighted images of the biliary and pancreatic ducts were obtained. Post-processing was applied at the acquisition scanner with concurrent physician supervision which includes 3D reconstructions, MIPs, volume rendered images and/or shaded surface rendering. CONTRAST:  6mL GADAVIST GADOBUTROL 1 MMOL/ML IV SOLN COMPARISON:  Earlier same day right upper quadrant ultrasound examination FINDINGS: Lower chest: No acute findings. Hepatobiliary: No mass or other parenchymal abnormality identified. Common bile duct measures 7 mm, at the upper limits of normal with at least 2 filling defects in the downstream duct measuring up to 2 mm (3:14). Distended gallbladder with cholelithiasis. Interval development of  trace pericholecystic free fluid and mild periportal edema. Pancreas: No mass, inflammatory changes, or other parenchymal abnormality identified. Spleen:  Splenomegaly measures 14.3 cm. Adrenals/Urinary Tract: No adrenal nodules. No suspicious renal masses identified. No evidence of hydronephrosis. Stomach/Bowel: Visualized portions within the abdomen are unremarkable. Vascular/Lymphatic: No pathologically enlarged lymph nodes identified. No abdominal aortic aneurysm demonstrated. Other:  None. Musculoskeletal: No suspicious bone lesions identified. IMPRESSION: 1. Choledocholithiasis with punctate, 2 mm stones in the downstream duct. 2. Distended gallbladder with cholelithiasis. Interval development of trace pericholecystic free fluid and mild periportal edema, likely reactive or related to fluid resuscitation. 3. Splenomegaly measures 14.3 cm. Electronically Signed   By: Agustin Cree M.D.   On: 11/07/2023 08:36   US Abdomen Limited RUQ (LIVER/GB) Result Date: 11/06/2023 CLINICAL DATA:  One day history of abdominal pain EXAM: ULTRASOUND ABDOMEN LIMITED RIGHT UPPER QUADRANT COMPARISON:  None Available. FINDINGS: Gallbladder: Innumerable gallstones measuring up to 8 mm. No wall thickening visualized. No sonographic Murphy sign noted by sonographer. Common bile duct: Diameter: 3 mm Liver: No focal lesion identified. Within normal limits in parenchymal echogenicity. Portal vein is patent on color Doppler imaging with normal direction of blood flow towards the liver. Other: None. IMPRESSION: Cholelithiasis without sonographic evidence of acute cholecystitis. Electronically Signed   By: Agustin Cree M.D.   On: 11/06/2023 15:39       Kathlen Mody M.D. [facility] 11/08/2023, 12:03 PM  Available via Epic secure chat 7am-7pm After 7 pm, please refer to night coverage provider listed on amion.

## 2023-11-08 NOTE — Transfer of Care (Signed)
 Immediate Anesthesia Transfer of Care Note  Patient: Olivia James  Procedure(s) Performed: Procedure(s): ERCP, WITH INTERVENTION IF INDICATED (N/A) ERCP, WITH REMOVAL OF COMMON BILE DUCT CALCULUS USING BALLOON SPHINCTEROTOMY, BILIARY  Patient Location: PACU  Anesthesia Type:General  Level of Consciousness: Alert, Awake, Oriented  Airway & Oxygen Therapy: Patient Spontanous Breathing  Post-op Assessment: Report given to RN  Post vital signs: Reviewed and stable  Last Vitals:  Vitals:   11/08/23 1038 11/08/23 1231  BP: 127/69 (!) 98/54  Pulse: (!) 55 71  Resp: 10 16  Temp: 37.1 C   SpO2: 97% 100%    Complications: No apparent anesthesia complications

## 2023-11-08 NOTE — Anesthesia Procedure Notes (Signed)
 Procedure Name: Intubation Date/Time: 11/08/2023 11:45 AM  Performed by: Basilio Cairo, CRNAPre-anesthesia Checklist: Patient identified, Patient being monitored, Timeout performed, Emergency Drugs available and Suction available Patient Re-evaluated:Patient Re-evaluated prior to induction Oxygen Delivery Method: Circle system utilized Preoxygenation: Pre-oxygenation with 100% oxygen Induction Type: IV induction Ventilation: Mask ventilation without difficulty Laryngoscope Size: Mac and 3 Grade View: Grade I Tube type: Oral Tube size: 7.0 mm Number of attempts: 1 Placement Confirmation: ETT inserted through vocal cords under direct vision, positive ETCO2 and breath sounds checked- equal and bilateral Secured at: 21 cm Tube secured with: Tape Dental Injury: Teeth and Oropharynx as per pre-operative assessment

## 2023-11-08 NOTE — TOC Initial Note (Signed)
 Transition of Care [facility]) - Initial/Assessment Note    Patient Details  Name: Olivia James MRN: [id number] Date of Birth: [birth year]  Transition of Care [facility]) CM/SW Contact:    Lanier Clam, RN Phone Number: 11/08/2023, 3:26 PM  Clinical Narrative:  d/c plan home.                 Expected Discharge Plan: Home/Self Care Barriers to Discharge: Continued Medical Work up   Patient Goals and CMS Choice Patient states their goals for this hospitalization and ongoing recovery are:: Home CMS Medicare.gov Compare Post Acute Care list provided to:: Patient Choice offered to / list presented to : Patient Annex ownership interest in [facility].provided to:: Patient    Expected Discharge Plan and Services                                              Prior Living Arrangements/Services                       Activities of Daily Living   ADL Screening (condition at time of admission) Independently performs ADLs?: Yes (appropriate for developmental age) Is the patient deaf or have difficulty hearing?: No Does the patient have difficulty seeing, even when wearing glasses/contacts?: No Does the patient have difficulty concentrating, remembering, or making decisions?: No  Permission Sought/Granted                  Emotional Assessment              Admission diagnosis:  RUQ abdominal pain [R10.11] Transaminitis [R74.01] Cholelithiasis [K80.20] Biliary calculus of other site without obstruction [K80.80] Choledocholithiasis [K80.50] Patient Active Problem List   Diagnosis Date Noted   Choledocholithiasis 11/07/2023   Cholelithiasis 11/06/2023   PCP:  [facility] Pharmacy:   [facility] [id number] - [place], [place] - [address] AT [facility] OF [address] [place] [address] Phone: [phone] Fax: [phone]     Social Drivers of Health (SDOH) Social History: SDOH  Screenings   Food Insecurity: No Food Insecurity (11/06/2023)  Housing: Low Risk  (11/06/2023)  Transportation Needs: No Transportation Needs (11/06/2023)  Utilities: Not At Risk (11/06/2023)  Depression (PHQ2-9): Medium Risk (07/01/2023)  Social Connections: Socially Isolated (11/06/2023)  Tobacco Use: Medium Risk (11/08/2023)   SDOH Interventions:     Readmission Risk Interventions     No data to display

## 2023-11-08 NOTE — Anesthesia Preprocedure Evaluation (Addendum)
 Anesthesia Evaluation  Patient identified by MRN, date of birth, ID band Patient awake    Reviewed: Allergy & Precautions, NPO status , Patient's Chart, lab work & pertinent test results  History of Anesthesia Complications Negative for: history of anesthetic complications  Airway Mallampati: I  TM Distance: >3 FB Neck ROM: Full    Dental  (+) Dental Advisory Given   Pulmonary neg shortness of breath, asthma (childhood, does not use inhalers) , neg sleep apnea, neg COPD, neg recent URI, former smoker   Pulmonary exam normal breath sounds clear to auscultation       Cardiovascular negative cardio ROS  Rhythm:Regular Rate:Normal     Neuro/Psych negative neurological ROS     GI/Hepatic Neg liver ROS,neg GERD  ,,Choledocholithiasis    Endo/Other  negative endocrine ROS    Renal/GU negative Renal ROS     Musculoskeletal   Abdominal   Peds  Hematology Reports taking iron for easy bleeding. No specific diagnoses.  Lab Results      Component                Value               Date                      WBC                      3.8 (L)             11/08/2023                HGB                      11.8 (L)            11/08/2023                HCT                      34.8 (L)            11/08/2023                MCV                      91.6                11/08/2023                PLT                      137 (L)             11/08/2023              Anesthesia Other Findings   Reproductive/Obstetrics Patient currently on period                              Anesthesia Physical Anesthesia Plan  ASA: 2  Anesthesia Plan: General   Post-op Pain Management:    Induction: Intravenous and Rapid sequence  PONV Risk Score and Plan: 3 and Ondansetron, Dexamethasone and Treatment may vary due to age or medical condition  Airway Management Planned: Oral ETT  Additional Equipment:   Intra-op  Plan:   Post-operative Plan: Extubation in OR  Informed Consent: I have reviewed the patients History and  Physical, chart, labs and discussed the procedure including the risks, benefits and alternatives for the proposed anesthesia with the patient or authorized representative who has indicated his/her understanding and acceptance.     Dental advisory given  Plan Discussed with: CRNA and Anesthesiologist  Anesthesia Plan Comments: (Risks of general anesthesia discussed including, but not limited to, sore throat, hoarse voice, chipped/damaged teeth, injury to vocal cords, nausea and vomiting, allergic reactions, lung infection, heart attack, stroke, and death. All questions answered. )         Anesthesia Quick Evaluation

## 2023-11-08 NOTE — Progress Notes (Signed)
 Progress Note  * Day of Surgery *  Subjective: Pt reports some back pain but not really having abdominal pain, nausea or vomiting. GI planning ERCP today. Discussed tentative plans for laparoscopic cholecystectomy tomorrow pending ERCP results. Her daughter was at bedside.   Objective: Vital signs in last 24 hours: Temp:  [98.4 F (36.9 C)-98.7 F (37.1 C)] 98.7 F (37.1 C) (03/24 0441) Pulse Rate:  [48-59] 48 (03/24 0441) Resp:  [16-19] 18 (03/24 0441) BP: (103-107)/(54-59) 103/56 (03/24 0441) SpO2:  [97 %-99 %] 97 % (03/24 0441) Last BM Date : 11/05/23 (per patient)  Intake/Output from previous day: 03/23 0701 - 03/24 0700 In: 1774.4 [P.O.:480; I.V.:1194.4; IV Piggyback:100] Out: 650 [Urine:650] Intake/Output this shift: No intake/output data recorded.  PE: General: pleasant, WD, WN female who is laying in bed in NAD HEENT: head is normocephalic, atraumatic.  Sclera are anicteric, EOMI.  Ears and nose without any masses or lesions.  Mouth is pink and moist Heart: regular, rate, and rhythm.  Normal s1,s2. No obvious murmurs, gallops, or rubs noted.  Palpable radial and pedal pulses bilaterally Lungs: CTAB, no wheezes, rhonchi, or rales noted.  Respiratory effort nonlabored Abd: soft, NT, ND, +BS, no masses, hernias, or organomegaly MS: all 4 extremities are symmetrical with no cyanosis, clubbing, or edema. Skin: warm and dry with no masses, lesions, or rashes Neuro: Cranial nerves 2-12 grossly intact, sensation is normal throughout Psych: A&Ox3 with an appropriate affect.    Lab Results:  Recent Labs    11/07/23 0529 11/08/23 0451  WBC 3.7* 3.8*  HGB 11.3* 11.8*  HCT 33.2* 34.8*  PLT 138* 137*   BMET Recent Labs    11/07/23 0529 11/08/23 0451  NA 137 139  K 3.4* 3.6  CL 111 113*  CO2 20* 19*  GLUCOSE 83 92  BUN 11 6  CREATININE 0.54 0.60  CALCIUM 8.3* 8.7*   PT/INR Recent Labs    11/06/23 1949  LABPROT 15.2  INR 1.2   CMP     Component Value  Date/Time   NA 139 11/08/2023 0451   K 3.6 11/08/2023 0451   CL 113 (H) 11/08/2023 0451   CO2 19 (L) 11/08/2023 0451   GLUCOSE 92 11/08/2023 0451   BUN 6 11/08/2023 0451   CREATININE 0.60 11/08/2023 0451   CALCIUM 8.7 (L) 11/08/2023 0451   PROT 5.7 (L) 11/08/2023 0451   ALBUMIN 3.2 (L) 11/08/2023 0451   AST 310 (H) 11/08/2023 0451   ALT 600 (H) 11/08/2023 0451   ALKPHOS 88 11/08/2023 0451   BILITOT 4.7 (H) 11/08/2023 0451   GFRNONAA >60 11/08/2023 0451   Lipase     Component Value Date/Time   LIPASE 31 11/06/2023 1204       Studies/Results: MR ABDOMEN MRCP W WO CONTAST Result Date: 11/07/2023 CLINICAL DATA:  Abdominal pain with cholelithiasis on ultrasound examination EXAM: MRI ABDOMEN WITHOUT AND WITH CONTRAST (INCLUDING MRCP) TECHNIQUE: Multiplanar multisequence MR imaging of the abdomen was performed both before and after the administration of intravenous contrast. Heavily T2-weighted images of the biliary and pancreatic ducts were obtained. Post-processing was applied at the acquisition scanner with concurrent physician supervision which includes 3D reconstructions, MIPs, volume rendered images and/or shaded surface rendering. CONTRAST:  6mL GADAVIST GADOBUTROL 1 MMOL/ML IV SOLN COMPARISON:  Earlier same day right upper quadrant ultrasound examination FINDINGS: Lower chest: No acute findings. Hepatobiliary: No mass or other parenchymal abnormality identified. Common bile duct measures 7 mm, at the upper limits of normal with at  least 2 filling defects in the downstream duct measuring up to 2 mm (3:14). Distended gallbladder with cholelithiasis. Interval development of trace pericholecystic free fluid and mild periportal edema. Pancreas: No mass, inflammatory changes, or other parenchymal abnormality identified. Spleen:  Splenomegaly measures 14.3 cm. Adrenals/Urinary Tract: No adrenal nodules. No suspicious renal masses identified. No evidence of hydronephrosis. Stomach/Bowel:  Visualized portions within the abdomen are unremarkable. Vascular/Lymphatic: No pathologically enlarged lymph nodes identified. No abdominal aortic aneurysm demonstrated. Other:  None. Musculoskeletal: No suspicious bone lesions identified. IMPRESSION: 1. Choledocholithiasis with punctate, 2 mm stones in the downstream duct. 2. Distended gallbladder with cholelithiasis. Interval development of trace pericholecystic free fluid and mild periportal edema, likely reactive or related to fluid resuscitation. 3. Splenomegaly measures 14.3 cm. Electronically Signed   By: Agustin Cree M.D.   On: 11/07/2023 08:36   MR 3D Recon At Scanner Result Date: 11/07/2023 CLINICAL DATA:  Abdominal pain with cholelithiasis on ultrasound examination EXAM: MRI ABDOMEN WITHOUT AND WITH CONTRAST (INCLUDING MRCP) TECHNIQUE: Multiplanar multisequence MR imaging of the abdomen was performed both before and after the administration of intravenous contrast. Heavily T2-weighted images of the biliary and pancreatic ducts were obtained. Post-processing was applied at the acquisition scanner with concurrent physician supervision which includes 3D reconstructions, MIPs, volume rendered images and/or shaded surface rendering. CONTRAST:  6mL GADAVIST GADOBUTROL 1 MMOL/ML IV SOLN COMPARISON:  Earlier same day right upper quadrant ultrasound examination FINDINGS: Lower chest: No acute findings. Hepatobiliary: No mass or other parenchymal abnormality identified. Common bile duct measures 7 mm, at the upper limits of normal with at least 2 filling defects in the downstream duct measuring up to 2 mm (3:14). Distended gallbladder with cholelithiasis. Interval development of trace pericholecystic free fluid and mild periportal edema. Pancreas: No mass, inflammatory changes, or other parenchymal abnormality identified. Spleen:  Splenomegaly measures 14.3 cm. Adrenals/Urinary Tract: No adrenal nodules. No suspicious renal masses identified. No evidence of  hydronephrosis. Stomach/Bowel: Visualized portions within the abdomen are unremarkable. Vascular/Lymphatic: No pathologically enlarged lymph nodes identified. No abdominal aortic aneurysm demonstrated. Other:  None. Musculoskeletal: No suspicious bone lesions identified. IMPRESSION: 1. Choledocholithiasis with punctate, 2 mm stones in the downstream duct. 2. Distended gallbladder with cholelithiasis. Interval development of trace pericholecystic free fluid and mild periportal edema, likely reactive or related to fluid resuscitation. 3. Splenomegaly measures 14.3 cm. Electronically Signed   By: Agustin Cree M.D.   On: 11/07/2023 08:36   US Abdomen Limited RUQ (LIVER/GB) Result Date: 11/06/2023 CLINICAL DATA:  One day history of abdominal pain EXAM: ULTRASOUND ABDOMEN LIMITED RIGHT UPPER QUADRANT COMPARISON:  None Available. FINDINGS: Gallbladder: Innumerable gallstones measuring up to 8 mm. No wall thickening visualized. No sonographic Murphy sign noted by sonographer. Common bile duct: Diameter: 3 mm Liver: No focal lesion identified. Within normal limits in parenchymal echogenicity. Portal vein is patent on color Doppler imaging with normal direction of blood flow towards the liver. Other: None. IMPRESSION: Cholelithiasis without sonographic evidence of acute cholecystitis. Electronically Signed   By: Agustin Cree M.D.   On: 11/06/2023 15:39    Anti-infectives: Anti-infectives (From admission, onward)    Start     Dose/Rate Route Frequency Ordered Stop   11/07/23 0900  cefTRIAXone (ROCEPHIN) 1 g in sodium chloride 0.9 % 100 mL IVPB        1 g 200 mL/hr over 30 Minutes Intravenous Every 24 hours 11/07/23 0742          Assessment/Plan  Choledocholithiasis  - Tbili 4.7 from 5.1 yestersday,  AST/ALT 310/600 from 430/683, Alk Phos normal  - no leukocytosis, Afebrile and HD stable  - MRCP with above, cholelithiasis, mild pericholecystic free fluid and periportal edema, splenomegaly  - GI following and  planning ERCP today - will tentatively plan lap chole tomorrow pending ERCP results - NPO after MN - I have explained the procedure, risks, and aftercare of Laparoscopic cholecystectomy.  Risks include but are not limited to anesthesia (MI, CVA, death, prolonged intubation and aspiration), bleeding, infection, wound problems, hernia, bile leak, injury to common bile duct/liver/intestine, possible need for subtotal cholecystectomy or open cholecystectomy, increased risk of DVT/PE and diarrhea post op.  She seems to understand and agrees to proceed.   FEN: NPO for procedure, IVF per TRH VTE: ok to have SQH or LMWH from surgical standpoint ID: rocephin for UTI   LOS: 1 day   I reviewed Consultant GI notes, hospitalist notes, last 24 h vitals and pain scores, last 48 h intake and output, last 24 h labs and trends, and last 24 h imaging results.  This care required high  level of medical decision making.    Juliet Rude, [facility] 11/08/2023, 8:54 AM Please see Amion for pager number during day hours 7:00am-4:30pm

## 2023-11-08 NOTE — Plan of Care (Signed)

## 2023-11-08 NOTE — Interval H&P Note (Signed)
 History and Physical Interval Note:  11/08/2023 11:33 AM  Olivia James  has presented today for surgery, with the diagnosis of Choledocholithiasis.  The various methods of treatment have been discussed with the patient and family. After consideration of risks, benefits and other options for treatment, the patient has consented to  Procedure(s): ERCP, WITH INTERVENTION IF INDICATED (N/A) as a surgical intervention.  The patient's history has been reviewed, patient examined, no change in status, stable for surgery.  I have reviewed the patient's chart and labs.  Questions were answered to the patient's satisfaction.     [name]

## 2023-11-08 NOTE — Anesthesia Postprocedure Evaluation (Signed)
 Anesthesia Post Note  Patient: Olivia James  Procedure(s) Performed: ERCP, WITH INTERVENTION IF INDICATED ERCP, WITH REMOVAL OF COMMON BILE DUCT CALCULUS USING BALLOON SPHINCTEROTOMY, BILIARY     Patient location during evaluation: PACU Anesthesia Type: General Level of consciousness: awake Pain management: pain level controlled Vital Signs Assessment: post-procedure vital signs reviewed and stable Respiratory status: spontaneous breathing, nonlabored ventilation and respiratory function stable Cardiovascular status: blood pressure returned to baseline and stable Postop Assessment: no apparent nausea or vomiting Anesthetic complications: no   No notable events documented.  Last Vitals:  Vitals:   11/08/23 1245 11/08/23 1250  BP:  (!) 90/57  Pulse: 64 (!) 56  Resp: 17 19  Temp:    SpO2: 96% 100%    Last Pain:  Vitals:   11/08/23 1240  TempSrc:   PainSc: 0-No pain                 Linton Rump

## 2023-11-08 NOTE — Op Note (Signed)
 [facility] Patient Name: Olivia James Procedure Date: 11/08/2023 MRN: [id number] Attending MD: Jeani Hawking , MD, [id number] Date of Birth: July 04, 1976 CSN: [id number] Age: 48 Admit Type: Inpatient Procedure:                ERCP Indications:              Common bile duct stone(s) Providers:                Jeani Hawking, MD, Fransisca Connors, Sunday Corn                            Mbumina, Technician Referring MD:              Medicines:                General Anesthesia Complications:            No immediate complications. Estimated Blood Loss:     Estimated blood loss: none. Procedure:                Pre-Anesthesia Assessment:                           - Prior to the procedure, a History and Physical                            was performed, and patient medications and                            allergies were reviewed. The patient's tolerance of                            previous anesthesia was also reviewed. The risks                            and benefits of the procedure and the sedation                            options and risks were discussed with the patient.                            All questions were answered, and informed consent                            was obtained. Prior Anticoagulants: The patient has                            taken no anticoagulant or antiplatelet agents. ASA                            Grade Assessment: II - A patient with mild systemic                            disease. After reviewing the risks and benefits,                            the  patient was deemed in satisfactory condition to                            undergo the procedure.                           - Sedation was administered by an anesthesia                            professional. General anesthesia was attained.                           After obtaining informed consent, the scope was                            passed under direct vision. Throughout the                             procedure, the patient's blood pressure, pulse, and                            oxygen saturations were monitored continuously. The                            [id number]-[id number] ([id number] Olympus duodenoscope was                            introduced through the mouth, and used to inject                            contrast into and used to inject contrast into the                            bile duct. The ERCP was accomplished without                            difficulty. The patient tolerated the procedure                            well. Scope In: Scope Out: Findings:      The major papilla was normal. The bile duct was deeply cannulated with       the short-nosed traction sphincterotome. Contrast was injected. I       personally interpreted the bile duct images. There was brisk flow of       contrast through the ducts. Image quality was excellent. Contrast       extended to the entire biliary tree. The common bile duct contained two       stones, the largest of which was 3 mm in diameter. The common bile duct       was diffusely dilated, acquired. The largest diameter was 10 mm. A short       0.035 inch Soft Jagwire was passed into the biliary tree. A 10 mm       biliary sphincterotomy was made with a monofilament traction (standard)       sphincterotome using  ERBE electrocautery. There was no       post-sphincterotomy bleeding. The biliary tree was swept with a 12 mm       balloon starting at the bifurcation. Two stones were removed. All stones       remained.      The ampulla was spontaneously draining bile. Cannulation of the CBD was       achieved during the first attempt. The guidewire was advanced to the       right intrahepatic ducts and secured. Contrast injection revealed a       distal CBD stone and a proxima common hepatic stone. The gallbladder       highlighted with contrast and numerous stones were noted. The CBD was       dilated to approximately 10 mm. A  10 mm sphincterotomy was made and both       stones were extracted with the 12 mm balloon. The final occlusion       cholangiogram was negative for any retained stones. Impression:               - The major papilla appeared normal.                           - The common bile duct was dilated, acquired.                           - Choledocholithiasis was found. Removal was not                            accomplished; no stent was inserted.                           - A biliary sphincterotomy was performed.                           - The biliary tree was swept. Moderate Sedation:      Not Applicable - Patient had care per Anesthesia. Recommendation:           - Return patient to hospital ward for ongoing care.                           - Resume regular diet and NPO.                           - Follow liver panel.                           - Lap chole per Surgery. Procedure Code(s):        --- Professional ---                           [phone], Endoscopic retrograde                            cholangiopancreatography (ERCP); with removal of                            calculi/debris from biliary/pancreatic duct(s)  [id number], Endoscopic retrograde                            cholangiopancreatography (ERCP); with                            sphincterotomy/papillotomy                           [phone], Endoscopic catheterization of the biliary                            ductal system, radiological supervision and                            interpretation Diagnosis Code(s):        --- Professional ---                           K80.50, Calculus of bile duct without cholangitis                            or cholecystitis without obstruction                           K83.8, Other specified diseases of biliary tract CPT copyright 2022 American [employer] Association. All rights reserved. The codes documented in this report are preliminary and upon coder review may  be revised to meet  current compliance requirements. Jeani Hawking, MD Jeani Hawking, MD 11/08/2023 12:26:00 PM This report has been signed electronically. Number of Addenda: 0

## 2023-11-08 NOTE — H&P (View-Only) (Signed)
 Progress Note  * Day of Surgery *  Subjective: Pt reports some back pain but not really having abdominal pain, nausea or vomiting. GI planning ERCP today. Discussed tentative plans for laparoscopic cholecystectomy tomorrow pending ERCP results. Her daughter was at bedside.   Objective: Vital signs in last 24 hours: Temp:  [98.4 F (36.9 C)-98.7 F (37.1 C)] 98.7 F (37.1 C) (03/24 0441) Pulse Rate:  [48-59] 48 (03/24 0441) Resp:  [16-19] 18 (03/24 0441) BP: (103-107)/(54-59) 103/56 (03/24 0441) SpO2:  [97 %-99 %] 97 % (03/24 0441) Last BM Date : 11/05/23 (per patient)  Intake/Output from previous day: 03/23 0701 - 03/24 0700 In: 1774.4 [P.O.:480; I.V.:1194.4; IV Piggyback:100] Out: 650 [Urine:650] Intake/Output this shift: No intake/output data recorded.  PE: General: pleasant, WD, WN female who is laying in bed in NAD HEENT: head is normocephalic, atraumatic.  Sclera are anicteric, EOMI.  Ears and nose without any masses or lesions.  Mouth is pink and moist Heart: regular, rate, and rhythm.  Normal s1,s2. No obvious murmurs, gallops, or rubs noted.  Palpable radial and pedal pulses bilaterally Lungs: CTAB, no wheezes, rhonchi, or rales noted.  Respiratory effort nonlabored Abd: soft, NT, ND, +BS, no masses, hernias, or organomegaly MS: all 4 extremities are symmetrical with no cyanosis, clubbing, or edema. Skin: warm and dry with no masses, lesions, or rashes Neuro: Cranial nerves 2-12 grossly intact, sensation is normal throughout Psych: A&Ox3 with an appropriate affect.    Lab Results:  Recent Labs    11/07/23 0529 11/08/23 0451  WBC 3.7* 3.8*  HGB 11.3* 11.8*  HCT 33.2* 34.8*  PLT 138* 137*   BMET Recent Labs    11/07/23 0529 11/08/23 0451  NA 137 139  K 3.4* 3.6  CL 111 113*  CO2 20* 19*  GLUCOSE 83 92  BUN 11 6  CREATININE 0.54 0.60  CALCIUM 8.3* 8.7*   PT/INR Recent Labs    11/06/23 1949  LABPROT 15.2  INR 1.2   CMP     Component Value  Date/Time   NA 139 11/08/2023 0451   K 3.6 11/08/2023 0451   CL 113 (H) 11/08/2023 0451   CO2 19 (L) 11/08/2023 0451   GLUCOSE 92 11/08/2023 0451   BUN 6 11/08/2023 0451   CREATININE 0.60 11/08/2023 0451   CALCIUM 8.7 (L) 11/08/2023 0451   PROT 5.7 (L) 11/08/2023 0451   ALBUMIN 3.2 (L) 11/08/2023 0451   AST 310 (H) 11/08/2023 0451   ALT 600 (H) 11/08/2023 0451   ALKPHOS 88 11/08/2023 0451   BILITOT 4.7 (H) 11/08/2023 0451   GFRNONAA >60 11/08/2023 0451   Lipase     Component Value Date/Time   LIPASE 31 11/06/2023 1204       Studies/Results: MR ABDOMEN MRCP W WO CONTAST Result Date: 11/07/2023 CLINICAL DATA:  Abdominal pain with cholelithiasis on ultrasound examination EXAM: MRI ABDOMEN WITHOUT AND WITH CONTRAST (INCLUDING MRCP) TECHNIQUE: Multiplanar multisequence MR imaging of the abdomen was performed both before and after the administration of intravenous contrast. Heavily T2-weighted images of the biliary and pancreatic ducts were obtained. Post-processing was applied at the acquisition scanner with concurrent physician supervision which includes 3D reconstructions, MIPs, volume rendered images and/or shaded surface rendering. CONTRAST:  6mL GADAVIST GADOBUTROL 1 MMOL/ML IV SOLN COMPARISON:  Earlier same day right upper quadrant ultrasound examination FINDINGS: Lower chest: No acute findings. Hepatobiliary: No mass or other parenchymal abnormality identified. Common bile duct measures 7 mm, at the upper limits of normal with at  least 2 filling defects in the downstream duct measuring up to 2 mm (3:14). Distended gallbladder with cholelithiasis. Interval development of trace pericholecystic free fluid and mild periportal edema. Pancreas: No mass, inflammatory changes, or other parenchymal abnormality identified. Spleen:  Splenomegaly measures 14.3 cm. Adrenals/Urinary Tract: No adrenal nodules. No suspicious renal masses identified. No evidence of hydronephrosis. Stomach/Bowel:  Visualized portions within the abdomen are unremarkable. Vascular/Lymphatic: No pathologically enlarged lymph nodes identified. No abdominal aortic aneurysm demonstrated. Other:  None. Musculoskeletal: No suspicious bone lesions identified. IMPRESSION: 1. Choledocholithiasis with punctate, 2 mm stones in the downstream duct. 2. Distended gallbladder with cholelithiasis. Interval development of trace pericholecystic free fluid and mild periportal edema, likely reactive or related to fluid resuscitation. 3. Splenomegaly measures 14.3 cm. Electronically Signed   By: Agustin Cree M.D.   On: 11/07/2023 08:36   MR 3D Recon At Scanner Result Date: 11/07/2023 CLINICAL DATA:  Abdominal pain with cholelithiasis on ultrasound examination EXAM: MRI ABDOMEN WITHOUT AND WITH CONTRAST (INCLUDING MRCP) TECHNIQUE: Multiplanar multisequence MR imaging of the abdomen was performed both before and after the administration of intravenous contrast. Heavily T2-weighted images of the biliary and pancreatic ducts were obtained. Post-processing was applied at the acquisition scanner with concurrent physician supervision which includes 3D reconstructions, MIPs, volume rendered images and/or shaded surface rendering. CONTRAST:  6mL GADAVIST GADOBUTROL 1 MMOL/ML IV SOLN COMPARISON:  Earlier same day right upper quadrant ultrasound examination FINDINGS: Lower chest: No acute findings. Hepatobiliary: No mass or other parenchymal abnormality identified. Common bile duct measures 7 mm, at the upper limits of normal with at least 2 filling defects in the downstream duct measuring up to 2 mm (3:14). Distended gallbladder with cholelithiasis. Interval development of trace pericholecystic free fluid and mild periportal edema. Pancreas: No mass, inflammatory changes, or other parenchymal abnormality identified. Spleen:  Splenomegaly measures 14.3 cm. Adrenals/Urinary Tract: No adrenal nodules. No suspicious renal masses identified. No evidence of  hydronephrosis. Stomach/Bowel: Visualized portions within the abdomen are unremarkable. Vascular/Lymphatic: No pathologically enlarged lymph nodes identified. No abdominal aortic aneurysm demonstrated. Other:  None. Musculoskeletal: No suspicious bone lesions identified. IMPRESSION: 1. Choledocholithiasis with punctate, 2 mm stones in the downstream duct. 2. Distended gallbladder with cholelithiasis. Interval development of trace pericholecystic free fluid and mild periportal edema, likely reactive or related to fluid resuscitation. 3. Splenomegaly measures 14.3 cm. Electronically Signed   By: Agustin Cree M.D.   On: 11/07/2023 08:36   US Abdomen Limited RUQ (LIVER/GB) Result Date: 11/06/2023 CLINICAL DATA:  One day history of abdominal pain EXAM: ULTRASOUND ABDOMEN LIMITED RIGHT UPPER QUADRANT COMPARISON:  None Available. FINDINGS: Gallbladder: Innumerable gallstones measuring up to 8 mm. No wall thickening visualized. No sonographic Murphy sign noted by sonographer. Common bile duct: Diameter: 3 mm Liver: No focal lesion identified. Within normal limits in parenchymal echogenicity. Portal vein is patent on color Doppler imaging with normal direction of blood flow towards the liver. Other: None. IMPRESSION: Cholelithiasis without sonographic evidence of acute cholecystitis. Electronically Signed   By: Agustin Cree M.D.   On: 11/06/2023 15:39    Anti-infectives: Anti-infectives (From admission, onward)    Start     Dose/Rate Route Frequency Ordered Stop   11/07/23 0900  cefTRIAXone (ROCEPHIN) 1 g in sodium chloride 0.9 % 100 mL IVPB        1 g 200 mL/hr over 30 Minutes Intravenous Every 24 hours 11/07/23 0742          Assessment/Plan  Choledocholithiasis  - Tbili 4.7 from 5.1 yestersday,  AST/ALT 310/600 from 430/683, Alk Phos normal  - no leukocytosis, Afebrile and HD stable  - MRCP with above, cholelithiasis, mild pericholecystic free fluid and periportal edema, splenomegaly  - GI following and  planning ERCP today - will tentatively plan lap chole tomorrow pending ERCP results - NPO after MN - I have explained the procedure, risks, and aftercare of Laparoscopic cholecystectomy.  Risks include but are not limited to anesthesia (MI, CVA, death, prolonged intubation and aspiration), bleeding, infection, wound problems, hernia, bile leak, injury to common bile duct/liver/intestine, possible need for subtotal cholecystectomy or open cholecystectomy, increased risk of DVT/PE and diarrhea post op.  She seems to understand and agrees to proceed.   FEN: NPO for procedure, IVF per [facility] VTE: ok to have SQH or LMWH from surgical standpoint ID: rocephin for UTI   LOS: 1 day   I reviewed Consultant GI notes, hospitalist notes, last 24 h vitals and pain scores, last 48 h intake and output, last 24 h labs and trends, and last 24 h imaging results.  This care required high  level of medical decision making.    Juliet Rude, [facility] Surgery 11/08/2023, 8:54 AM Please see Amion for pager number during day hours 7:00am-4:30pm

## 2023-11-09 ENCOUNTER — Encounter (HOSPITAL_COMMUNITY)
Admission: EM | Disposition: A | Discharge status: Home / Self Care | Payer: Self-pay | Source: Home / Self Care | Attending: Internal Medicine

## 2023-11-09 ENCOUNTER — Inpatient Hospital Stay (HOSPITAL_COMMUNITY): Admitting: Anesthesiology

## 2023-11-09 ENCOUNTER — Other Ambulatory Visit: Payer: Self-pay

## 2023-11-09 ENCOUNTER — Encounter (HOSPITAL_COMMUNITY): Payer: Self-pay | Admitting: Internal Medicine

## 2023-11-09 DIAGNOSIS — K8080 Other cholelithiasis without obstruction: Secondary | ICD-10-CM | POA: Diagnosis not present

## 2023-11-09 DIAGNOSIS — R1011 Right upper quadrant pain: Secondary | ICD-10-CM | POA: Diagnosis not present

## 2023-11-09 DIAGNOSIS — K8050 Calculus of bile duct without cholangitis or cholecystitis without obstruction: Secondary | ICD-10-CM | POA: Diagnosis not present

## 2023-11-09 DIAGNOSIS — K8043 Calculus of bile duct with acute cholecystitis with obstruction: Secondary | ICD-10-CM | POA: Diagnosis not present

## 2023-11-09 DIAGNOSIS — K8060 Calculus of gallbladder and bile duct with cholecystitis, unspecified, without obstruction: Secondary | ICD-10-CM

## 2023-11-09 HISTORY — PX: CHOLECYSTECTOMY: SHX55

## 2023-11-09 LAB — COMPREHENSIVE METABOLIC PANEL
ALT: 564 U/L — ABNORMAL HIGH (ref 0–44)
AST: 216 U/L — ABNORMAL HIGH (ref 15–41)
Albumin: 3.5 g/dL (ref 3.5–5.0)
Alkaline Phosphatase: 98 U/L (ref 38–126)
Anion gap: 5 (ref 5–15)
BUN: 6 mg/dL (ref 6–20)
CO2: 19 mmol/L — ABNORMAL LOW (ref 22–32)
Calcium: 9.1 mg/dL (ref 8.9–10.3)
Chloride: 112 mmol/L — ABNORMAL HIGH (ref 98–111)
Creatinine, Ser: 0.67 mg/dL (ref 0.44–1.00)
GFR, Estimated: >60 mL/min (ref >60)
Glucose, Bld: 99 mg/dL (ref 70–99)
Potassium: 3.4 mmol/L — ABNORMAL LOW (ref 3.5–5.1)
Sodium: 136 mmol/L (ref 135–145)
Total Bilirubin: 2.1 mg/dL — ABNORMAL HIGH (ref 0.0–1.2)
Total Protein: 6.2 g/dL — ABNORMAL LOW (ref 6.5–8.1)

## 2023-11-09 LAB — URINE CULTURE: Culture: >=100000 — AB

## 2023-11-09 SURGERY — LAPAROSCOPIC CHOLECYSTECTOMY
Anesthesia: General

## 2023-11-09 MED ORDER — DEXAMETHASONE SODIUM PHOSPHATE 10 MG/ML IJ SOLN
INTRAMUSCULAR | Status: DC | PRN
  Administered 2023-11-09: 8 mg via INTRAVENOUS

## 2023-11-09 MED ORDER — FENTANYL CITRATE (PF) 100 MCG/2ML IJ SOLN
INTRAMUSCULAR | Status: AC
  Filled 2023-11-09: qty 2

## 2023-11-09 MED ORDER — MIDAZOLAM HCL 2 MG/2ML IJ SOLN
INTRAMUSCULAR | Status: AC
  Filled 2023-11-09: qty 2

## 2023-11-09 MED ORDER — HYDROMORPHONE HCL 1 MG/ML IJ SOLN
INTRAMUSCULAR | Status: DC | PRN
  Administered 2023-11-09 (×3): .2 mg via INTRAVENOUS

## 2023-11-09 MED ORDER — OXYCODONE HCL 5 MG PO TABS: 5.0000 mg | ORAL_TABLET | Freq: Once | ORAL | Status: DC | PRN

## 2023-11-09 MED ORDER — ROCURONIUM BROMIDE 10 MG/ML (PF) SYRINGE
PREFILLED_SYRINGE | INTRAVENOUS | Status: AC
  Filled 2023-11-09: qty 10

## 2023-11-09 MED ORDER — ROCURONIUM BROMIDE 100 MG/10ML IV SOLN
INTRAVENOUS | Status: DC | PRN
  Administered 2023-11-09: 60 mg via INTRAVENOUS

## 2023-11-09 MED ORDER — SUGAMMADEX SODIUM 200 MG/2ML IV SOLN
INTRAVENOUS | Status: AC
  Filled 2023-11-09: qty 2

## 2023-11-09 MED ORDER — 0.9 % SODIUM CHLORIDE (POUR BTL) OPTIME
TOPICAL | Status: DC | PRN
Start: 2023-11-09 — End: 2023-11-09
  Administered 2023-11-09: 1000 mL

## 2023-11-09 MED ORDER — ONDANSETRON HCL 4 MG/2ML IJ SOLN
INTRAMUSCULAR | Status: AC
  Filled 2023-11-09: qty 2

## 2023-11-09 MED ORDER — FENTANYL CITRATE (PF) 100 MCG/2ML IJ SOLN
INTRAMUSCULAR | Status: DC | PRN
  Administered 2023-11-09: 100 ug via INTRAVENOUS
  Administered 2023-11-09 (×2): 50 ug via INTRAVENOUS

## 2023-11-09 MED ORDER — LACTATED RINGERS IR SOLN
Status: DC | PRN
  Administered 2023-11-09: 1000 mL

## 2023-11-09 MED ORDER — BUPIVACAINE-EPINEPHRINE 0.25% -1:200000 IJ SOLN
INTRAMUSCULAR | Status: DC | PRN
  Administered 2023-11-09: 10 mL

## 2023-11-09 MED ORDER — LIDOCAINE HCL (PF) 2 % IJ SOLN
INTRAMUSCULAR | Status: DC | PRN
Start: 2023-11-09 — End: 2023-11-09
  Administered 2023-11-09: 70 mg via INTRADERMAL

## 2023-11-09 MED ORDER — DROPERIDOL 2.5 MG/ML IJ SOLN: 0.6250 mg | Freq: Once | INTRAMUSCULAR | Status: DC | PRN

## 2023-11-09 MED ORDER — HYDROMORPHONE HCL 2 MG/ML IJ SOLN
INTRAMUSCULAR | Status: AC
Start: 2023-11-09 — End: ?
  Filled 2023-11-09: qty 1

## 2023-11-09 MED ORDER — PROPOFOL 10 MG/ML IV BOLUS
INTRAVENOUS | Status: AC
  Filled 2023-11-09: qty 20

## 2023-11-09 MED ORDER — HYDROMORPHONE HCL 1 MG/ML IJ SOLN
0.2500 mg | INTRAMUSCULAR | Status: DC | PRN
  Administered 2023-11-09 (×2): 0.25 mg via INTRAVENOUS

## 2023-11-09 MED ORDER — HYDROMORPHONE HCL 1 MG/ML IJ SOLN
INTRAMUSCULAR | Status: AC
  Administered 2023-11-09: 0.5 mg via INTRAVENOUS
  Filled 2023-11-09: qty 2

## 2023-11-09 MED ORDER — POTASSIUM CHLORIDE CRYS ER 20 MEQ PO TBCR
40.0000 meq | EXTENDED_RELEASE_TABLET | Freq: Two times a day (BID) | ORAL | Status: AC
Start: 2023-11-09 — End: 2023-11-09
  Administered 2023-11-09 (×2): 40 meq via ORAL
  Filled 2023-11-09 (×2): qty 2

## 2023-11-09 MED ORDER — OXYCODONE HCL 5 MG/5ML PO SOLN: 5.0000 mg | Freq: Once | ORAL | Status: DC | PRN

## 2023-11-09 MED ORDER — OXYCODONE HCL 5 MG PO TABS: 5.0000 mg | ORAL_TABLET | Freq: Four times a day (QID) | ORAL | 0 refills | Status: AC | PRN

## 2023-11-09 MED ORDER — BUPIVACAINE-EPINEPHRINE (PF) 0.25% -1:200000 IJ SOLN
INTRAMUSCULAR | Status: AC
  Filled 2023-11-09: qty 30

## 2023-11-09 MED ORDER — INDOCYANINE GREEN 25 MG IV SOLR
INTRAVENOUS | Status: DC | PRN
  Administered 2023-11-09: 2.5 mg via INTRAVENOUS

## 2023-11-09 MED ORDER — PROPOFOL 10 MG/ML IV BOLUS
INTRAVENOUS | Status: DC | PRN
  Administered 2023-11-09: 160 mg via INTRAVENOUS

## 2023-11-09 MED ORDER — SUGAMMADEX SODIUM 200 MG/2ML IV SOLN
INTRAVENOUS | Status: DC | PRN
  Administered 2023-11-09: 100 mg via INTRAVENOUS

## 2023-11-09 MED ORDER — DEXAMETHASONE SODIUM PHOSPHATE 10 MG/ML IJ SOLN
INTRAMUSCULAR | Status: AC
  Filled 2023-11-09: qty 1

## 2023-11-09 MED ORDER — SCOPOLAMINE 1 MG/3DAYS TD PT72
1.0000 | MEDICATED_PATCH | TRANSDERMAL | Status: DC
  Administered 2023-11-09: 1.5 mg via TRANSDERMAL

## 2023-11-09 MED ORDER — LIDOCAINE HCL (PF) 2 % IJ SOLN
INTRAMUSCULAR | Status: AC
  Filled 2023-11-09: qty 5

## 2023-11-09 MED ORDER — SCOPOLAMINE 1 MG/3DAYS TD PT72
MEDICATED_PATCH | TRANSDERMAL | Status: AC
  Filled 2023-11-09: qty 1

## 2023-11-09 MED ORDER — MIDAZOLAM HCL 2 MG/2ML IJ SOLN
INTRAMUSCULAR | Status: DC | PRN
  Administered 2023-11-09: 2 mg via INTRAVENOUS

## 2023-11-09 MED ORDER — ONDANSETRON HCL 4 MG/2ML IJ SOLN: 4.0000 mg | Freq: Once | INTRAMUSCULAR | Status: DC | PRN

## 2023-11-09 SURGICAL SUPPLY — 27 items
APPLIER CLIP ROT 10 11.4 M/L (STAPLE) ×1 IMPLANT
CHLORAPREP W/TINT 26 (MISCELLANEOUS) ×1 IMPLANT
CLIP APPLIE ROT 10 11.4 M/L (STAPLE) ×1 IMPLANT
COVER MAYO STAND XLG (MISCELLANEOUS) IMPLANT
DERMABOND ADVANCED .7 DNX12 (GAUZE/BANDAGES/DRESSINGS) ×1 IMPLANT
DRAPE C-ARM 42X120 X-RAY (DRAPES) IMPLANT
ELECT REM PT RETURN 15FT ADLT (MISCELLANEOUS) ×1 IMPLANT
GLOVE INDICATOR 8.0 STRL GRN (GLOVE) ×1 IMPLANT
GLOVE SS BIOGEL STRL SZ 8 (GLOVE) ×1 IMPLANT
GOWN STRL REUS W/ TWL XL LVL3 (GOWN DISPOSABLE) ×2 IMPLANT
HEMOSTAT SURGICEL 4X8 (HEMOSTASIS) IMPLANT
IRRIG SUCT STRYKERFLOW 2 WTIP (MISCELLANEOUS) ×1 IMPLANT
IRRIGATION SUCT STRKRFLW 2 WTP (MISCELLANEOUS) ×1 IMPLANT
KIT BASIN OR (CUSTOM PROCEDURE TRAY) ×1 IMPLANT
KIT IMAGING PINPOINTPAQ (MISCELLANEOUS) IMPLANT
KIT TURNOVER KIT A (KITS) IMPLANT
SCISSORS LAP 5X35 DISP (ENDOMECHANICALS) ×1 IMPLANT
SET CHOLANGIOGRAPH MIX (MISCELLANEOUS) IMPLANT
SET TUBE SMOKE EVAC HIGH FLOW (TUBING) ×1 IMPLANT
SLEEVE Z-THREAD 5X100MM (TROCAR) ×1 IMPLANT
SPIKE FLUID TRANSFER (MISCELLANEOUS) ×1 IMPLANT
SUT MNCRL AB 4-0 PS2 18 (SUTURE) ×1 IMPLANT
TOWEL OR 17X26 10 PK STRL BLUE (TOWEL DISPOSABLE) ×1 IMPLANT
TRAY LAPAROSCOPIC (CUSTOM PROCEDURE TRAY) ×1 IMPLANT
TROCAR 11X100 Z THREAD (TROCAR) ×1 IMPLANT
TROCAR BALLN 12MMX100 BLUNT (TROCAR) ×1 IMPLANT
TROCAR Z-THREAD OPTICAL 5X100M (TROCAR) ×1 IMPLANT

## 2023-11-09 NOTE — Anesthesia Procedure Notes (Signed)
 Procedure Name: Intubation Date/Time: 11/09/2023 7:35 AM  Performed by: Ovidio Kin, CRNAPre-anesthesia Checklist: Patient identified, Emergency Drugs available, Suction available and Patient being monitored Patient Re-evaluated:Patient Re-evaluated prior to induction Oxygen Delivery Method: Circle system utilized Preoxygenation: Pre-oxygenation with 100% oxygen Induction Type: IV induction Ventilation: Mask ventilation without difficulty Laryngoscope Size: Mac and 3 Grade View: Grade I Tube type: Oral Tube size: 7.0 mm Airway Equipment and Method: Stylet Placement Confirmation: ETT inserted through vocal cords under direct vision Secured at: 21 cm Tube secured with: Tape Dental Injury: Teeth and Oropharynx as per pre-operative assessment

## 2023-11-09 NOTE — Progress Notes (Signed)
 [facility]                                                                               Olivia James, is a 48 y.o. female, DOB - 1975/11/22, ZDG:[id number] Admit date - 11/06/2023    Outpatient Primary MD for the patient is Pa, [facility]  LOS - 2  days    Brief summary   Olivia James is a 48 y.o. female with no pertinent past medical history presenting to the ED with RUQ pain.   Assessment & Plan    Assessment and Plan:  RUQ PAIN associated with nausea and vomiting.  Choledocholithiasis,  stone in the distal CBD with dilatation of the CBD duct on MRCP GI on board, plan for ERCP today.  Pain control   Continue with IV fluids and symptomatic management with anti emetics.  Gen surgery consulted, s/p lap cholecystectomy . Pain.  She remains afebrile, normal wbc count.  Lipase wnl.  Elevated liver enzymes on admission, slowly improving.  T bili elevated at 5.1. improved to 2.1  Continue to monitor.  LA is wnl.  Acute hepatitis panel is negative.    E Coli UTI:  Complete the course of antibiotics.    Mild thrombocytopenia Platelets of 138,000. Monitor.   Hypokalemia Replaced. Repeat level wnl.  Magnesium level wnl.   Estimated body mass index is 20.36 kg/m as calculated from the following:   Height as of this encounter: 5\' 7"  (1.702 m).   Weight as of this encounter: 59 kg.  Code Status: full code.  DVT Prophylaxis:  enoxaparin (LOVENOX) injection 40 mg Start: 11/06/23 2200   Level of Care: Level of care: Med-Surg Family Communication: Updated patient's family at bedside.   Disposition Plan:     Remains inpatient appropriate:  pending clinical improvement.   Procedures:  MRCP Lap cholecystectomy.   Consultants:   GI General surgery.   Antimicrobials:   Anti-infectives (From admission, onward)    Start     Dose/Rate Route Frequency Ordered Stop   11/07/23 0900  cefTRIAXone (ROCEPHIN) 1 g in sodium chloride 0.9 % 100  mL IVPB        1 g 200 mL/hr over 30 Minutes Intravenous Every 24 hours 11/07/23 0742          Medications  Scheduled Meds:  enoxaparin (LOVENOX) injection  40 mg Subcutaneous Q24H   potassium chloride  40 mEq Oral BID   scopolamine       Continuous Infusions:  cefTRIAXone (ROCEPHIN)  IV 1 g (11/08/23 0920)   PRN Meds:.acetaminophen **OR** acetaminophen, HYDROmorphone (DILAUDID) injection, lip balm, LORazepam, ondansetron **OR** ondansetron (ZOFRAN) IV, mouth rinse, oxyCODONE, scopolamine    Subjective:   Olivia James was seen and examined today. Pain suboptimal.   Objective:   Vitals:   11/09/23 0915 11/09/23 0930 11/09/23 0945 11/09/23 1228  BP: 118/74 113/67 104/63 102/62  Pulse: (!) 45 (!) 45 (!) 45 (!) 54  Resp: 12 15 12 17   Temp:    98.2 F (36.8 C)  TempSrc:    Oral  SpO2: 94% 92% 92% 97%  Weight:      Height:  Intake/Output Summary (Last 24 hours) at 11/09/2023 1336 Last data filed at 11/09/2023 0846 Gross per 24 hour  Intake --  Output 5 ml  Net -5 ml   Filed Weights   11/06/23 1151 11/09/23 0628  Weight: 59 kg 59 kg     Exam  General exam: Appears calm and comfortable  Respiratory system: Clear to auscultation. Respiratory effort normal. Cardiovascular system: S1 & S2 heard, RRR. No JVD,  Gastrointestinal system: Abdomen is soft . Incision sites clean. Mild upper discomfort.  Central nervous system: Alert and oriented. No focal neurological deficits. Extremities: Symmetric 5 x 5 power. Skin: No rashes, lesions or ulcers Psychiatry: Mood & affect appropriate.     Data Reviewed:  I have personally reviewed following labs and imaging studies   CBC Lab Results  Component Value Date   WBC 3.8 (L) 11/08/2023   RBC 3.80 (L) 11/08/2023   HGB 11.8 (L) 11/08/2023   HCT 34.8 (L) 11/08/2023   MCV 91.6 11/08/2023   MCH 31.1 11/08/2023   PLT 137 (L) 11/08/2023   MCHC 33.9 11/08/2023   RDW 13.2 11/08/2023   LYMPHSABS 1.5  11/08/2023   MONOABS 0.3 11/08/2023   EOSABS 0.1 11/08/2023   BASOSABS 0.0 11/08/2023     Last metabolic panel Lab Results  Component Value Date   NA 136 11/09/2023   K 3.4 (L) 11/09/2023   CL 112 (H) 11/09/2023   CO2 19 (L) 11/09/2023   BUN 6 11/09/2023   CREATININE 0.67 11/09/2023   GLUCOSE 99 11/09/2023   GFRNONAA >60 11/09/2023   CALCIUM 9.1 11/09/2023   PROT 6.2 (L) 11/09/2023   ALBUMIN 3.5 11/09/2023   BILITOT 2.1 (H) 11/09/2023   ALKPHOS 98 11/09/2023   AST 216 (H) 11/09/2023   ALT 564 (H) 11/09/2023   ANIONGAP 5 11/09/2023    CBG (last 3)  No results for input(s): "GLUCAP" in the last 72 hours.    Coagulation Profile: Recent Labs  Lab 11/06/23 1949  INR 1.2     Radiology Studies: DG ERCP Result Date: 11/09/2023 CLINICAL DATA:  Choledocholithiasis. EXAM: ERCP TECHNIQUE: Multiple spot images obtained with the fluoroscopic device and submitted for interpretation post-procedure. COMPARISON:  MRI/MRCP on 11/06/2023 FINDINGS: C-arm imaging demonstrates cannulation of the common bile duct with cholangiogram showing filling defects within the common bile duct. Reflux of contrast via the cystic duct demonstrates abundant calculi within the gallbladder. Balloon sweep maneuver was performed to remove common duct calculi. Completion cholangiogram shows normally patent common bile duct without filling defects. IMPRESSION: Choledocholithiasis with successful balloon sweep maneuver. Electronically Signed   By: Irish Lack M.D.   On: 11/09/2023 10:06       Kathlen Mody M.D. [facility] 11/09/2023, 1:36 PM  Available via Epic secure chat 7am-7pm After 7 pm, please refer to night coverage provider listed on amion.

## 2023-11-09 NOTE — Op Note (Signed)
 Laparoscopic Cholecystectomy with ICG dye  Procedure Note  Indications: This patient presents with symptomatic gallbladder disease and will undergo laparoscopic cholecystectomy.  Patient with ERCP yesterday with clearance of her doctor presents today for laparoscopic cholecystectomy.  Risks and benefits reviewed.  Long-term recovery and complications of surgery reviewed as well as not surgical options of treatment.The procedure has been discussed with the patient. Operative and non operative treatments have been discussed. Risks of surgery include bleeding, infection,  Common bile duct injury,  Injury to the stomach,liver, colon,small intestine, abdominal wall,  Diaphragm,  Major blood vessels,  And the need for an open procedure.  Other risks include worsening of medical problems, death,  DVT and pulmonary embolism, and cardiovascular events.   Medical options have also been discussed. The patient has been informed of long term expectations of surgery and non surgical options,  The patient agrees to proceed.     Pre-operative Diagnosis: Choledocholithiasis, acute cholecystitis and gallstone pancreatitis  Post-operative Diagnosis: Same  Surgeon: Dortha Schwalbe MD  Assistants: Or staff  Anesthesia: General endotracheal anesthesia and Local anesthesia 0.25.% bupivacaine  ASA Class: 2  Procedure Details  The patient was seen again in the Holding Room. The risks, benefits, complications, treatment options, and expected outcomes were discussed with the patient. The possibilities of reaction to medication, pulmonary aspiration, perforation of viscus, bleeding, recurrent infection, finding a normal gallbladder, the need for additional procedures, failure to diagnose a condition, the possible need to convert to an open procedure, and creating a complication requiring transfusion or operation were discussed with the patient. The patient and/or family concurred with the proposed plan, giving informed  consent. The site of surgery properly noted/marked. The patient was taken to Operating Room, identified as Olivia James and the procedure verified as Laparoscopic Cholecystectomy with Intraoperative Cholangiograms. A Time Out was held and the above information confirmed.  Prior to the induction of general anesthesia, antibiotic prophylaxis was administered. General endotracheal anesthesia was then administered and tolerated well. After the induction, the abdomen was prepped in the usual sterile fashion. The patient was positioned in the supine position with the left arm comfortably tucked, along with some reverse Trendelenburg.  Local anesthetic agent was injected into the skin near the umbilicus and an incision made. The midline fascia was incised and the Hasson technique was used to introduce a 12 mm port under direct vision. It was secured with a figure of eight Vicryl suture placed in the usual fashion. Pneumoperitoneum was then created with CO2 and tolerated well without any adverse changes in the patient's vital signs. Additional trocars were introduced under direct vision with an 11 mm trocar in the epigastrium and 2 5 mm trocars in the right upper quadrant. All skin incisions were infiltrated with a local anesthetic agent before making the incision and placing the trocars.   The gallbladder was identified, the fundus grasped and retracted cephalad. Adhesions were lysed bluntly and with the electrocautery where indicated, taking care not to injure any adjacent organs or viscus. The infundibulum was grasped and retracted laterally, exposing the peritoneum overlying the triangle of Calot. This was then divided and exposed in a blunt fashion. The cystic duct was clearly identified and bluntly dissected circumferentially. The junctions of the gallbladder, cystic duct and common bile duct were clearly identified prior to the division of any linear structure.   ICG fluoroscopy used.  The cystic  duct, common bile duct interface was identified.  There is no significant common bile duct dilation noted.  The critical view was obtained with a 360 degree visualization of the junction of the cystic duct entering the gallbladder.  The cystic duct was quite small therefore cholangiogram was not performed.  Patient had a previous ERCP yesterday.  The cystic duct was then  ligated with surgical clips  on the patient side and  clipped on the gallbladder side and divided. The cystic artery was identified, dissected free, ligated with clips and divided as well. Posterior cystic artery clipped and divided.  The gallbladder was dissected from the liver bed in retrograde fashion with the electrocautery. The gallbladder was removed with an EKOS pouch. The liver bed was irrigated and inspected. Hemostasis was achieved with the electrocautery. Copious irrigation was utilized and was repeatedly aspirated until clear all particulate matter. Hemostasis was achieved with no signs  Of bleeding or bile leakage.  Gallbladder removed via the umbilical incision and passed off the field  Pneumoperitoneum was completely reduced after viewing removal of the trocars under direct vision. The wound was thoroughly irrigated and the fascia was then closed with a figure of eight suture of 0 Vicryl; the skin was then closed with 4 Monocryl and a sterile dressing of Dermabond was applied.  Instrument, sponge, and needle counts were correct at closure and at the conclusion of the case.   Findings: Cholecystitis with Cholelithiasis  Estimated Blood Loss: Minimal         Drains: None         Total IV Fluids: Per OR record         Specimens: Gallbladder           Complications: None; patient tolerated the procedure well.         Disposition: PACU - hemodynamically stable.         Condition: stable

## 2023-11-09 NOTE — Anesthesia Preprocedure Evaluation (Signed)
 Anesthesia Evaluation  Patient identified by MRN, date of birth, ID band Patient awake    Reviewed: Allergy & Precautions, NPO status , Patient's Chart, lab work & pertinent test results  History of Anesthesia Complications Negative for: history of anesthetic complications  Airway Mallampati: I  TM Distance: >3 FB Neck ROM: Full    Dental no notable dental hx. (+) Dental Advisory Given, Teeth Intact   Pulmonary neg shortness of breath, asthma (childhood, does not use inhalers) , neg sleep apnea, neg COPD, neg recent URI, Patient abstained from smoking., former smoker   Pulmonary exam normal breath sounds clear to auscultation       Cardiovascular negative cardio ROS  Rhythm:Regular Rate:Normal     Neuro/Psych negative neurological ROS  negative psych ROS   GI/Hepatic Neg liver ROS,neg GERD  ,,Cholelithiasis with Choledocholithiasis S/P ERCP yesterday   Endo/Other  negative endocrine ROS    Renal/GU negative Renal ROS  negative genitourinary   Musculoskeletal negative musculoskeletal ROS (+)    Abdominal   Peds  Hematology negative hematology ROS (+) Reports taking iron for easy bleeding. No specific diagnoses.  Lab Results      Component                Value               Date                      WBC                      3.8 (L)             11/08/2023                HGB                      11.8 (L)            11/08/2023                HCT                      34.8 (L)            11/08/2023                MCV                      91.6                11/08/2023                PLT                      137 (L)             11/08/2023              Anesthesia Other Findings   Reproductive/Obstetrics negative OB ROS Patient currently on period                              Anesthesia Physical Anesthesia Plan  ASA: 2  Anesthesia Plan: General   Post-op Pain Management: Dilaudid IV,  Ofirmev IV (intra-op)* and Precedex   Induction: Intravenous and Rapid sequence  PONV Risk Score and Plan: 3 and Ondansetron, Dexamethasone, Treatment may vary due to age  or medical condition, Scopolamine patch - Pre-op and Midazolam  Airway Management Planned: Oral ETT  Additional Equipment: None  Intra-op Plan:   Post-operative Plan: Extubation in OR  Informed Consent: I have reviewed the patients History and Physical, chart, labs and discussed the procedure including the risks, benefits and alternatives for the proposed anesthesia with the patient or authorized representative who has indicated his/her understanding and acceptance.     Dental advisory given  Plan Discussed with: CRNA and Anesthesiologist  Anesthesia Plan Comments: ( )         Anesthesia Quick Evaluation

## 2023-11-09 NOTE — Progress Notes (Signed)
 Patient left floor with transport at 0600 in wheelchair.

## 2023-11-09 NOTE — Progress Notes (Deleted)
 0500 Shower and CHG Bath with wipes completed.  Linen changed, clean gown provided, and patient brushed teeth.

## 2023-11-09 NOTE — Discharge Instructions (Signed)

## 2023-11-09 NOTE — Interval H&P Note (Signed)
 History and Physical Interval Note:  11/09/2023 7:13 AM  Olivia James  has presented today for surgery, with the diagnosis of CHOLEDOCOLITHASIS.  The various methods of treatment have been discussed with the patient and family. After consideration of risks, benefits and other options for treatment, the patient has consented to  Procedure(s) with comments: LAPAROSCOPIC CHOLECYSTECTOMY (N/A) - ICG as a surgical intervention.  The patient's history has been reviewed, patient examined, no change in status, stable for surgery.  I have reviewed the patient's chart and labs.  Questions were answered to the patient's satisfaction.    The procedure has been discussed with the patient. Operative and non operative treatments have been discussed. Risks of surgery include bleeding, infection,  Common bile duct injury,  Injury to the stomach,liver, colon,small intestine, abdominal wall,  Diaphragm,  Major blood vessels,  And the need for an open procedure.  Other risks include worsening of medical problems, death,  DVT and pulmonary embolism, and cardiovascular events.   Medical options have also been discussed. The patient has been informed of long term expectations of surgery and non surgical options,  The patient agrees to proceed.    [name] A Erskin Zinda

## 2023-11-09 NOTE — Transfer of Care (Signed)
 Immediate Anesthesia Transfer of Care Note  Patient: Olivia James  Procedure(s) Performed: LAPAROSCOPIC CHOLECYSTECTOMY WITH ICG  Patient Location: PACU  Anesthesia Type:General  Level of Consciousness: awake, alert , and oriented  Airway & Oxygen Therapy: Patient Spontanous Breathing and Patient connected to face mask oxygen  Post-op Assessment: Report given to RN and Post -op Vital signs reviewed and stable  Post vital signs: Reviewed and stable  Last Vitals:  Vitals Value Taken Time  BP    Temp    Pulse 70 11/09/23 0846  Resp 23 11/09/23 0846  SpO2 100 % 11/09/23 0846  Vitals shown include unfiled device data.  Last Pain:  Vitals:   11/09/23 0441  TempSrc: Oral  PainSc: 0-No pain         Complications: No notable events documented.

## 2023-11-09 NOTE — Anesthesia Postprocedure Evaluation (Signed)
 Anesthesia Post Note  Patient: Olivia James  Procedure(s) Performed: LAPAROSCOPIC CHOLECYSTECTOMY WITH ICG     Patient location during evaluation: PACU Anesthesia Type: General Level of consciousness: awake and alert and oriented Pain management: pain level controlled Vital Signs Assessment: post-procedure vital signs reviewed and stable Respiratory status: spontaneous breathing, nonlabored ventilation and respiratory function stable Cardiovascular status: blood pressure returned to baseline and stable Postop Assessment: no apparent nausea or vomiting Anesthetic complications: no   No notable events documented.  Last Vitals:  Vitals:   11/09/23 0900 11/09/23 0915  BP: 130/76 118/74  Pulse: 61 (!) 45  Resp: (!) 25 12  Temp:    SpO2: 99% 94%    Last Pain:  Vitals:   11/09/23 0910  TempSrc:   PainSc: 5                  Conleigh Heinlein A.

## 2023-11-09 NOTE — Plan of Care (Signed)
   Problem: Education: Goal: Knowledge of General Education information will improve Description: Including pain rating scale, medication(s)/side effects and non-pharmacologic comfort measures Outcome: Progressing   Problem: Health Behavior/Discharge Planning: Goal: Ability to manage health-related needs will improve Outcome: Progressing   Problem: Clinical Measurements: Goal: Will remain free from infection Outcome: Progressing

## 2023-11-10 ENCOUNTER — Encounter (HOSPITAL_COMMUNITY): Payer: Self-pay | Admitting: Surgery

## 2023-11-10 DIAGNOSIS — K8050 Calculus of bile duct without cholangitis or cholecystitis without obstruction: Secondary | ICD-10-CM | POA: Diagnosis not present

## 2023-11-10 DIAGNOSIS — N3000 Acute cystitis without hematuria: Secondary | ICD-10-CM | POA: Diagnosis not present

## 2023-11-10 DIAGNOSIS — K8043 Calculus of bile duct with acute cholecystitis with obstruction: Secondary | ICD-10-CM | POA: Diagnosis not present

## 2023-11-10 DIAGNOSIS — R7401 Elevation of levels of liver transaminase levels: Secondary | ICD-10-CM | POA: Diagnosis not present

## 2023-11-10 LAB — COMPREHENSIVE METABOLIC PANEL
ALT: 439 U/L — ABNORMAL HIGH (ref 0–44)
AST: 148 U/L — ABNORMAL HIGH (ref 15–41)
Albumin: 2.9 g/dL — ABNORMAL LOW (ref 3.5–5.0)
Alkaline Phosphatase: 76 U/L (ref 38–126)
Anion gap: 8 (ref 5–15)
BUN: 7 mg/dL (ref 6–20)
CO2: 22 mmol/L (ref 22–32)
Calcium: 8.8 mg/dL — ABNORMAL LOW (ref 8.9–10.3)
Chloride: 110 mmol/L (ref 98–111)
Creatinine, Ser: 0.71 mg/dL (ref 0.44–1.00)
GFR, Estimated: >60 mL/min (ref >60)
Glucose, Bld: 95 mg/dL (ref 70–99)
Potassium: 4.1 mmol/L (ref 3.5–5.1)
Sodium: 140 mmol/L (ref 135–145)
Total Bilirubin: 0.9 mg/dL (ref 0.0–1.2)
Total Protein: 5.3 g/dL — ABNORMAL LOW (ref 6.5–8.1)

## 2023-11-10 LAB — CBC WITH DIFFERENTIAL/PLATELET
Abs Immature Granulocytes: 0.02 10*3/uL (ref 0.00–0.07)
Basophils Absolute: 0 10*3/uL (ref 0.0–0.1)
Basophils Relative: 0 %
Eosinophils Absolute: 0.1 10*3/uL (ref 0.0–0.5)
Eosinophils Relative: 1 %
HCT: 34.1 % — ABNORMAL LOW (ref 36.0–46.0)
Hemoglobin: 11.2 g/dL — ABNORMAL LOW (ref 12.0–15.0)
Immature Granulocytes: 0 %
Lymphocytes Relative: 30 %
Lymphs Abs: 2.1 10*3/uL (ref 0.7–4.0)
MCH: 30.9 pg (ref 26.0–34.0)
MCHC: 32.8 g/dL (ref 30.0–36.0)
MCV: 93.9 fL (ref 80.0–100.0)
Monocytes Absolute: 0.5 10*3/uL (ref 0.1–1.0)
Monocytes Relative: 7 %
Neutro Abs: 4.3 10*3/uL (ref 1.7–7.7)
Neutrophils Relative %: 62 %
Platelets: 140 10*3/uL — ABNORMAL LOW (ref 150–400)
RBC: 3.63 MIL/uL — ABNORMAL LOW (ref 3.87–5.11)
RDW: 13.6 % (ref 11.5–15.5)
WBC: 6.9 10*3/uL (ref 4.0–10.5)
nRBC: 0 % (ref 0.0–0.2)

## 2023-11-10 LAB — SURGICAL PATHOLOGY

## 2023-11-10 MED ORDER — GUAIFENESIN-DM 100-10 MG/5ML PO SYRP
5.0000 mL | ORAL_SOLUTION | ORAL | Status: DC | PRN
  Administered 2023-11-10: 5 mL via ORAL
  Filled 2023-11-10: qty 10

## 2023-11-10 MED ORDER — AMOXICILLIN 500 MG PO CAPS: 500.0000 mg | ORAL_CAPSULE | Freq: Three times a day (TID) | ORAL | 0 refills | Status: AC

## 2023-11-10 MED ORDER — ONDANSETRON HCL 4 MG PO TABS: 4.0000 mg | ORAL_TABLET | Freq: Three times a day (TID) | ORAL | 0 refills | Status: AC | PRN

## 2023-11-10 NOTE — TOC Transition Note (Signed)
 Transition of Care [facility]) - Discharge Note   Patient Details  Name: KIKI BIVENS MRN: [id number] Date of Birth: 1976-07-04  [facility]) CM/SW Contact:  Lanier Clam, RN Phone Number: 11/10/2023, 11:17 AM   Clinical Narrative: d/c home No CM needs.      Final next level of care: Home/Self Care Barriers to Discharge: No Barriers Identified   Patient Goals and CMS Choice Patient states their goals for this hospitalization and ongoing recovery are:: Home CMS Medicare.gov Compare Post Acute Care list provided to:: Patient Choice offered to / list presented to : Patient [place] ownership interest in [facility].provided to:: Patient    Discharge Placement                       Discharge Plan and Services Additional resources added to the After Visit Summary for                                       Social Drivers of Health (SDOH) Interventions SDOH Screenings   Food Insecurity: No Food Insecurity (11/06/2023)  Housing: Low Risk  (11/06/2023)  Transportation Needs: No Transportation Needs (11/06/2023)  Utilities: Not At Risk (11/06/2023)  Depression (PHQ2-9): Medium Risk (07/01/2023)  Social Connections: Socially Isolated (11/06/2023)  Tobacco Use: Medium Risk (11/09/2023)     Readmission Risk Interventions     No data to display

## 2023-11-10 NOTE — Discharge Summary (Signed)
 Physician Discharge Summary   Patient: Olivia James MRN: [id number] DOB: [birth year]  Admit date:     11/06/2023  Discharge date: 11/10/23  Discharge Physician: Kathlen Mody   PCP: [facility]   Recommendations at discharge:  Please follow up with gastroenterology and Gen surgery as recommended.  Please follow up with cbc and bmp in one week.  Please follo w up with PCP in 1 to 2 weeks.   Discharge Diagnoses: Principal Problem:   Cholelithiasis Active Problems:   Choledocholithiasis  Resolved Problems:   * No resolved hospital problems. [facility] Course: Olivia James is a 48 y.o. female with no pertinent past medical history presenting to the ED with RUQ pain.   Assessment and Plan:  RUQ PAIN associated with nausea and vomiting.  Choledocholithiasis,  stone in the distal CBD with dilatation of the CBD duct on MRCP GI on board,  underwent ERCP and stones extracted.  Gen surgery consulted, s/p lap cholecystectomy . Pain control.  She remains afebrile, normal wbc count.  Lipase wnl.  Elevated liver enzymes on admission, slowly improving.  T bili elevated at 5.1. improved to 2.1  Continue to monitor.  LA is wnl.  Acute hepatitis panel is negative.      E Coli UTI:  Complete the course of antibiotics.      Mild thrombocytopenia Platelets of 138,000. Monitor.    Hypokalemia Replaced. Repeat level wnl.  Magnesium level wnl.    Estimated body mass index is 20.36 kg/m as calculated from the following:   Height as of this encounter: 5\' 7"  (1.702 m).   Weight as of this encounter: 59 kg.     Consultants: gi , general surgery.  Procedures performed: s/plap cholecystectomy. ERCP  Disposition: Home Diet recommendation:  Discharge Diet Orders (From admission, onward)     Start     Ordered   11/10/23 0000  Diet - low sodium heart healthy        11/10/23 1012           Regular diet DISCHARGE MEDICATION: Allergies as of 11/10/2023        Reactions   Latex Rash        Medication List     STOP taking these medications    Motrin IB 200 MG Caps Generic drug: Ibuprofen   tranexamic acid 650 MG Tabs tablet Commonly known as: LYSTEDA       TAKE these medications    ACCRUFeR 30 MG Caps Generic drug: Ferric Maltol Take 30 mg by mouth 2 (two) times daily.   amoxicillin 500 MG capsule Commonly known as: AMOXIL Take 1 capsule (500 mg total) by mouth 3 (three) times daily for 2 days.   Nurtec 75 MG Tbdp Generic drug: Rimegepant Sulfate Take 75 mg by mouth daily as needed (for migraine- dissolve orally- max of 75 mg/24 hours).   ondansetron 4 MG tablet Commonly known as: Zofran Take 1 tablet (4 mg total) by mouth every 8 (eight) hours as needed for nausea or vomiting.   oxyCODONE 5 MG immediate release tablet Commonly known as: Oxy IR/ROXICODONE Take 1 tablet (5 mg total) by mouth every 6 (six) hours as needed for moderate pain (pain score 4-6).   topiramate 50 MG tablet Commonly known as: TOPAMAX Take 50 mg by mouth at bedtime.   Vitamin D (Ergocalciferol) 1.25 MG (50000 UNIT) Caps capsule Commonly known as: DRISDOL Take 50,000 Units by mouth every Wednesday.  Follow-up Information     Olivia James, [place]. Go on 12/07/2023.   Specialty: General Surgery Why: 9:15 AM, please arrive 30 min prior to appointment time to check in. Contact information: [address] [facility] [place] [address] [phone]         [facility], [facility]. Schedule an appointment as soon as possible for a visit in 1 week(s).   Specialty: Internal Medicine Contact information: [address] [place] [address] [phone]                Discharge Exam: Filed Weights   11/06/23 1151 11/09/23 0628  Weight: 59 kg 59 kg   General exam: Appears calm and comfortable  Respiratory system: Clear to auscultation. Respiratory effort normal. Cardiovascular system:  S1 & S2 heard, RRR.  Gastrointestinal system: Abdomen is nondistended, soft , mild tenderness,    Central nervous system: Alert and oriented. No focal neurological deficits. Extremities: Symmetric 5 x 5 power. Skin: No rashes, lesions or ulcers Psychiatry: Mood & affect appropriate.    Condition at discharge: fair  The results of significant diagnostics from this hospitalization (including imaging, microbiology, ancillary and laboratory) are listed below for reference.   Imaging Studies: DG ERCP Result Date: 11/09/2023 CLINICAL DATA:  Choledocholithiasis. EXAM: ERCP TECHNIQUE: Multiple spot images obtained with the fluoroscopic device and submitted for interpretation post-procedure. COMPARISON:  MRI/MRCP on 11/06/2023 FINDINGS: C-arm imaging demonstrates cannulation of the common bile duct with cholangiogram showing filling defects within the common bile duct. Reflux of contrast via the cystic duct demonstrates abundant calculi within the gallbladder. Balloon sweep maneuver was performed to remove common duct calculi. Completion cholangiogram shows normally patent common bile duct without filling defects. IMPRESSION: Choledocholithiasis with successful balloon sweep maneuver. Electronically Signed   By: Irish Lack M.D.   On: 11/09/2023 10:06   MR ABDOMEN MRCP W WO CONTAST Result Date: 11/07/2023 CLINICAL DATA:  Abdominal pain with cholelithiasis on ultrasound examination EXAM: MRI ABDOMEN WITHOUT AND WITH CONTRAST (INCLUDING MRCP) TECHNIQUE: Multiplanar multisequence MR imaging of the abdomen was performed both before and after the administration of intravenous contrast. Heavily T2-weighted images of the biliary and pancreatic ducts were obtained. Post-processing was applied at the acquisition scanner with concurrent physician supervision which includes 3D reconstructions, MIPs, volume rendered images and/or shaded surface rendering. CONTRAST:  6mL GADAVIST GADOBUTROL 1 MMOL/ML IV SOLN  COMPARISON:  Earlier same day right upper quadrant ultrasound examination FINDINGS: Lower chest: No acute findings. Hepatobiliary: No mass or other parenchymal abnormality identified. Common bile duct measures 7 mm, at the upper limits of normal with at least 2 filling defects in the downstream duct measuring up to 2 mm (3:14). Distended gallbladder with cholelithiasis. Interval development of trace pericholecystic free fluid and mild periportal edema. Pancreas: No mass, inflammatory changes, or other parenchymal abnormality identified. Spleen:  Splenomegaly measures 14.3 cm. Adrenals/Urinary Tract: No adrenal nodules. No suspicious renal masses identified. No evidence of hydronephrosis. Stomach/Bowel: Visualized portions within the abdomen are unremarkable. Vascular/Lymphatic: No pathologically enlarged lymph nodes identified. No abdominal aortic aneurysm demonstrated. Other:  None. Musculoskeletal: No suspicious bone lesions identified. IMPRESSION: 1. Choledocholithiasis with punctate, 2 mm stones in the downstream duct. 2. Distended gallbladder with cholelithiasis. Interval development of trace pericholecystic free fluid and mild periportal edema, likely reactive or related to fluid resuscitation. 3. Splenomegaly measures 14.3 cm. Electronically Signed   By: Agustin Cree M.D.   On: 11/07/2023 08:36   MR 3D Recon At Scanner Result Date: 11/07/2023  CLINICAL DATA:  Abdominal pain with cholelithiasis on ultrasound examination EXAM: MRI ABDOMEN WITHOUT AND WITH CONTRAST (INCLUDING MRCP) TECHNIQUE: Multiplanar multisequence MR imaging of the abdomen was performed both before and after the administration of intravenous contrast. Heavily T2-weighted images of the biliary and pancreatic ducts were obtained. Post-processing was applied at the acquisition scanner with concurrent physician supervision which includes 3D reconstructions, MIPs, volume rendered images and/or shaded surface rendering. CONTRAST:  6mL GADAVIST  GADOBUTROL 1 MMOL/ML IV SOLN COMPARISON:  Earlier same day right upper quadrant ultrasound examination FINDINGS: Lower chest: No acute findings. Hepatobiliary: No mass or other parenchymal abnormality identified. Common bile duct measures 7 mm, at the upper limits of normal with at least 2 filling defects in the downstream duct measuring up to 2 mm (3:14). Distended gallbladder with cholelithiasis. Interval development of trace pericholecystic free fluid and mild periportal edema. Pancreas: No mass, inflammatory changes, or other parenchymal abnormality identified. Spleen:  Splenomegaly measures 14.3 cm. Adrenals/Urinary Tract: No adrenal nodules. No suspicious renal masses identified. No evidence of hydronephrosis. Stomach/Bowel: Visualized portions within the abdomen are unremarkable. Vascular/Lymphatic: No pathologically enlarged lymph nodes identified. No abdominal aortic aneurysm demonstrated. Other:  None. Musculoskeletal: No suspicious bone lesions identified. IMPRESSION: 1. Choledocholithiasis with punctate, 2 mm stones in the downstream duct. 2. Distended gallbladder with cholelithiasis. Interval development of trace pericholecystic free fluid and mild periportal edema, likely reactive or related to fluid resuscitation. 3. Splenomegaly measures 14.3 cm. Electronically Signed   By: Agustin Cree M.D.   On: 11/07/2023 08:36   US Abdomen Limited RUQ (LIVER/GB) Result Date: 11/06/2023 CLINICAL DATA:  One day history of abdominal pain EXAM: ULTRASOUND ABDOMEN LIMITED RIGHT UPPER QUADRANT COMPARISON:  None Available. FINDINGS: Gallbladder: Innumerable gallstones measuring up to 8 mm. No wall thickening visualized. No sonographic Murphy sign noted by sonographer. Common bile duct: Diameter: 3 mm Liver: No focal lesion identified. Within normal limits in parenchymal echogenicity. Portal vein is patent on color Doppler imaging with normal direction of blood flow towards the liver. Other: None. IMPRESSION:  Cholelithiasis without sonographic evidence of acute cholecystitis. Electronically Signed   By: Agustin Cree M.D.   On: 11/06/2023 15:39    Microbiology: Results for orders placed or performed during the hospital encounter of 11/06/23  Urine Culture (for pregnant, neutropenic or urologic patients or patients with an indwelling urinary catheter)     Status: Abnormal   Collection Time: 11/06/23  6:14 PM   Specimen: Urine, Clean Catch  Result Value Ref Range Status   Specimen Description   Final    URINE, CLEAN CATCH Performed at [facility], [address], [place], [place] [address]    Special Requests   Final    NONE Performed at [facility], [address], [place], [place] [address]    Culture >=100,000 COLONIES/mL ESCHERICHIA COLI (A)  Final   Report Status 11/09/2023 FINAL  Final   Organism ID, Bacteria ESCHERICHIA COLI (A)  Final      Susceptibility   Escherichia coli - MIC*    AMPICILLIN <=2 SENSITIVE Sensitive     CEFAZOLIN <=4 SENSITIVE Sensitive     CEFEPIME <=0.12 SENSITIVE Sensitive     CEFTRIAXONE <=0.25 SENSITIVE Sensitive     CIPROFLOXACIN <=0.25 SENSITIVE Sensitive     GENTAMICIN <=1 SENSITIVE Sensitive     IMIPENEM 1 SENSITIVE Sensitive     NITROFURANTOIN 64 INTERMEDIATE Intermediate     TRIMETH/SULFA <=20 SENSITIVE Sensitive     AMPICILLIN/SULBACTAM <=2 SENSITIVE Sensitive  PIP/TAZO <=4 SENSITIVE Sensitive ug/mL    * >=100,000 COLONIES/mL ESCHERICHIA COLI    Labs: CBC: Recent Labs  Lab 11/06/23 1204 11/07/23 0529 11/08/23 0451 11/10/23 0534  WBC 4.4 3.7* 3.8* 6.9  NEUTROABS  --   --  1.9 4.3  HGB 14.5 11.3* 11.8* 11.2*  HCT 42.7 33.2* 34.8* 34.1*  MCV 89.5 90.5 91.6 93.9  PLT 166 138* 137* 140*   Basic Metabolic Panel: Recent Labs  Lab 11/06/23 1204 11/07/23 0528 11/07/23 0529 11/08/23 0451 11/09/23 0541 11/10/23 0534  NA 135  --  137 139 136 140  K 3.7  --  3.4* 3.6 3.4* 4.1  CL 106  --  111 113*  112* 110  CO2 22  --  20* 19* 19* 22  GLUCOSE 97  --  83 92 99 95  BUN 14  --  11 6 6 7   CREATININE 0.51  --  0.54 0.60 0.67 0.71  CALCIUM 9.6  --  8.3* 8.7* 9.1 8.8*  MG  --  2.1  --   --   --   --    Liver Function Tests: Recent Labs  Lab 11/06/23 1204 11/07/23 0529 11/08/23 0451 11/09/23 0541 11/10/23 0534  AST 1,318* 430* 310* 216* 148*  ALT 1,208* 683* 600* 564* 439*  ALKPHOS 96 82 88 98 76  BILITOT 4.1* 5.1* 4.7* 2.1* 0.9  PROT 7.4 5.4* 5.7* 6.2* 5.3*  ALBUMIN 4.5 3.1* 3.2* 3.5 2.9*   CBG: No results for input(s): "GLUCAP" in the last 168 hours.  Discharge time spent: 38 minutes.   Signed: Kathlen Mody, MD [facility] 11/10/2023

## 2023-11-10 NOTE — Progress Notes (Signed)
 Progress Note  1 Day Post-Op  Subjective: Pt with some mild abdominal soreness as expected. Passing flatus. No nausea or vomiting. Daughter at bedside.   Objective: Vital signs in last 24 hours: Temp:  [98.2 F (36.8 C)-99.1 F (37.3 C)] 98.3 F (36.8 C) (03/26 0439) Pulse Rate:  [46-61] 61 (03/26 0439) Resp:  [17-20] 18 (03/26 0439) BP: (98-108)/(60-69) 98/60 (03/26 0439) SpO2:  [95 %-98 %] 97 % (03/26 0439) Last BM Date : 11/05/23  Intake/Output from previous day: 03/25 0701 - 03/26 0700 In: 680 [P.O.:480; IV Piggyback:200] Out: 5 [Blood:5] Intake/Output this shift: No intake/output data recorded.  PE: General: pleasant, WD, WN female who is laying in bed in NAD HEENT: Sclera are anicteric Lungs:  Respiratory effort nonlabored Abd: soft, appropriately ttp, ND, incisions C/D/I Psych: A&Ox3 with an appropriate affect.    Lab Results:  Recent Labs    11/08/23 0451 11/10/23 0534  WBC 3.8* 6.9  HGB 11.8* 11.2*  HCT 34.8* 34.1*  PLT 137* 140*   BMET Recent Labs    11/09/23 0541 11/10/23 0534  NA 136 140  K 3.4* 4.1  CL 112* 110  CO2 19* 22  GLUCOSE 99 95  BUN 6 7  CREATININE 0.67 0.71  CALCIUM 9.1 8.8*   PT/INR No results for input(s): "LABPROT", "INR" in the last 72 hours. CMP     Component Value Date/Time   NA 140 11/10/2023 0534   K 4.1 11/10/2023 0534   CL 110 11/10/2023 0534   CO2 22 11/10/2023 0534   GLUCOSE 95 11/10/2023 0534   BUN 7 11/10/2023 0534   CREATININE 0.71 11/10/2023 0534   CALCIUM 8.8 (L) 11/10/2023 0534   PROT 5.3 (L) 11/10/2023 0534   ALBUMIN 2.9 (L) 11/10/2023 0534   AST 148 (H) 11/10/2023 0534   ALT 439 (H) 11/10/2023 0534   ALKPHOS 76 11/10/2023 0534   BILITOT 0.9 11/10/2023 0534   GFRNONAA >60 11/10/2023 0534   Lipase     Component Value Date/Time   LIPASE 31 11/06/2023 1204       Studies/Results: DG ERCP Result Date: 11/09/2023 CLINICAL DATA:  Choledocholithiasis. EXAM: ERCP TECHNIQUE: Multiple spot  images obtained with the fluoroscopic device and submitted for interpretation post-procedure. COMPARISON:  MRI/MRCP on 11/06/2023 FINDINGS: C-arm imaging demonstrates cannulation of the common bile duct with cholangiogram showing filling defects within the common bile duct. Reflux of contrast via the cystic duct demonstrates abundant calculi within the gallbladder. Balloon sweep maneuver was performed to remove common duct calculi. Completion cholangiogram shows normally patent common bile duct without filling defects. IMPRESSION: Choledocholithiasis with successful balloon sweep maneuver. Electronically Signed   By: Irish Lack M.D.   On: 11/09/2023 10:06    Anti-infectives: Anti-infectives (From admission, onward)    Start     Dose/Rate Route Frequency Ordered Stop   11/10/23 0000  amoxicillin (AMOXIL) 500 MG capsule        500 mg Oral 3 times daily 11/10/23 1012 11/12/23 2359   11/07/23 0900  cefTRIAXone (ROCEPHIN) 1 g in sodium chloride 0.9 % 100 mL IVPB        1 g 200 mL/hr over 30 Minutes Intravenous Every 24 hours 11/07/23 0742          Assessment/Plan Choledocholithiasis S/P ERCP 3/24 Dr. Elnoria Howard  POD1 S/p laparoscopic cholecystectomy 3/25 Dr. Luisa Hart - stable for DC from surgery perspective - follow up and instructions in chart and reviewed with patient  - letter for work provided   FEN:  reg diet  VTE: LMWH ID: PO amoxicillin for E. coli UTI   LOS: 3 days      Juliet Rude, [facility] Surgery 11/10/2023, 10:31 AM Please see Amion for pager number during day hours 7:00am-4:30pm

## 2024-01-04 ENCOUNTER — Other Ambulatory Visit: Payer: Self-pay | Admitting: Obstetrics and Gynecology

## 2024-01-04 DIAGNOSIS — N920 Excessive and frequent menstruation with regular cycle: Secondary | ICD-10-CM

## 2024-01-26 ENCOUNTER — Other Ambulatory Visit: Payer: Medicaid Other

## 2024-03-10 ENCOUNTER — Ambulatory Visit: Payer: Self-pay | Admitting: Family Medicine

## 2024-03-10 ENCOUNTER — Encounter: Payer: Self-pay | Admitting: Family Medicine

## 2024-03-10 ENCOUNTER — Ambulatory Visit: Admitting: Family Medicine

## 2024-03-10 VITALS — BP 110/70 | HR 69 | Temp 98.7°F | Ht 67.0 in | Wt 131.0 lb

## 2024-03-10 DIAGNOSIS — G43909 Migraine, unspecified, not intractable, without status migrainosus: Secondary | ICD-10-CM | POA: Insufficient documentation

## 2024-03-10 DIAGNOSIS — D649 Anemia, unspecified: Secondary | ICD-10-CM

## 2024-03-10 DIAGNOSIS — Z79899 Other long term (current) drug therapy: Secondary | ICD-10-CM | POA: Diagnosis not present

## 2024-03-10 DIAGNOSIS — F17200 Nicotine dependence, unspecified, uncomplicated: Secondary | ICD-10-CM

## 2024-03-10 DIAGNOSIS — G43809 Other migraine, not intractable, without status migrainosus: Secondary | ICD-10-CM

## 2024-03-10 DIAGNOSIS — N920 Excessive and frequent menstruation with regular cycle: Secondary | ICD-10-CM

## 2024-03-10 DIAGNOSIS — R748 Abnormal levels of other serum enzymes: Secondary | ICD-10-CM

## 2024-03-10 DIAGNOSIS — E559 Vitamin D deficiency, unspecified: Secondary | ICD-10-CM

## 2024-03-10 DIAGNOSIS — Z6820 Body mass index (BMI) 20.0-20.9, adult: Secondary | ICD-10-CM

## 2024-03-10 LAB — CBC WITH DIFFERENTIAL/PLATELET
Basophils Absolute: 0 K/uL (ref 0.0–0.1)
Basophils Relative: 0.6 % (ref 0.0–3.0)
Eosinophils Absolute: 0.1 K/uL (ref 0.0–0.7)
Eosinophils Relative: 2 % (ref 0.0–5.0)
HCT: 38.9 % (ref 36.0–46.0)
Hemoglobin: 12.9 g/dL (ref 12.0–15.0)
Lymphocytes Relative: 39.2 % (ref 12.0–46.0)
Lymphs Abs: 1.8 K/uL (ref 0.7–4.0)
MCHC: 33.2 g/dL (ref 30.0–36.0)
MCV: 86.9 fl (ref 78.0–100.0)
Monocytes Absolute: 0.3 K/uL (ref 0.1–1.0)
Monocytes Relative: 7 % (ref 3.0–12.0)
Neutro Abs: 2.3 K/uL (ref 1.4–7.7)
Neutrophils Relative %: 51.2 % (ref 43.0–77.0)
Platelets: 165 K/uL (ref 150.0–400.0)
RBC: 4.48 Mil/uL (ref 3.87–5.11)
RDW: 13.6 % (ref 11.5–15.5)
WBC: 4.6 K/uL (ref 4.0–10.5)

## 2024-03-10 LAB — COMPREHENSIVE METABOLIC PANEL WITH GFR
ALT: 33 U/L (ref 0–35)
AST: 27 U/L (ref 0–37)
Albumin: 4.3 g/dL (ref 3.5–5.2)
Alkaline Phosphatase: 52 U/L (ref 39–117)
BUN: 12 mg/dL (ref 6–23)
CO2: 25 meq/L (ref 19–32)
Calcium: 9 mg/dL (ref 8.4–10.5)
Chloride: 108 meq/L (ref 96–112)
Creatinine, Ser: 0.67 mg/dL (ref 0.40–1.20)
GFR: 103.06 mL/min (ref >60.00)
Glucose, Bld: 83 mg/dL (ref 70–99)
Potassium: 3.7 meq/L (ref 3.5–5.1)
Sodium: 139 meq/L (ref 135–145)
Total Bilirubin: 0.5 mg/dL (ref 0.2–1.2)
Total Protein: 6.8 g/dL (ref 6.0–8.3)

## 2024-03-10 MED ORDER — SUMATRIPTAN SUCCINATE 25 MG PO TABS: 25.0000 mg | ORAL_TABLET | ORAL | 0 refills | Status: DC | PRN

## 2024-03-10 MED ORDER — TOPIRAMATE 25 MG PO TABS: 25.0000 mg | ORAL_TABLET | Freq: Every day | ORAL | 1 refills | Status: AC

## 2024-03-10 NOTE — Assessment & Plan Note (Signed)
 Reports control with tranexamic acid . Continue same.

## 2024-03-10 NOTE — Patient Instructions (Addendum)
 Welcome to [facility]!  Thank you for choosing us  for your Primary Care needs.   We offer in person and video appointments for your convenience. You may call our office to schedule appointments, or you may schedule appointments with me through MyChart.   The best way to get in contact with me is via MyChart message. This will get to me faster than a phone call, unless there is an emergency, then please call 911.  The lab is located downstairs in the Sports Medicine building, we also have xray available there.   We are checking labs today, will be in contact with any results that require further attention  I have sent in topamax for you to take 25mg  once daily at bedtime  I have sent in imitrex for you to take for headaches.   You may take 1 tablet at the onset of a migraine, you may take a second tablet 2 hours later if the headache is still present. We do not take more than 2 tablets in 24 hours. This medication can make you a bit sleepy, do not drive or operate machinery until you know how this medication will affect you.

## 2024-03-10 NOTE — Assessment & Plan Note (Signed)
 Continue Topamax at bedtime. Imitrex as needed.

## 2024-03-10 NOTE — Progress Notes (Deleted)
 New Patient Visit  Subjective:     Patient ID: Olivia James, female    DOB: March 17, 1976, 48 y.o.   MRN: [id number]  No chief complaint on file.   HPI  Discussed the use of AI scribe software for clinical note transcription with the patient, who gave verbal consent to proceed.  History of Present Illness      ROS Per HPI  Outpatient Encounter Medications as of 03/10/2024  Medication Sig   ACCRUFER 30 MG CAPS Take 30 mg by mouth 2 (two) times daily. (Patient not taking: Reported on 11/06/2023)   NURTEC 75 MG TBDP Take 75 mg by mouth daily as needed (for migraine- dissolve orally- max of 75 mg/24 hours).   ondansetron  (ZOFRAN ) 4 MG tablet Take 1 tablet (4 mg total) by mouth every 8 (eight) hours as needed for nausea or vomiting.   oxyCODONE  (OXY IR/ROXICODONE ) 5 MG immediate release tablet Take 1 tablet (5 mg total) by mouth every 6 (six) hours as needed for moderate pain (pain score 4-6).   topiramate (TOPAMAX) 50 MG tablet Take 50 mg by mouth at bedtime.   tranexamic acid  (LYSTEDA ) 650 MG TABS tablet TAKE 2 TABLETS(1300 MG) BY MOUTH THREE TIMES DAILY DURING MENSES. MAXIMUM OF 5 DAYS   Vitamin D, Ergocalciferol, (DRISDOL) 1.25 MG (50000 UNIT) CAPS capsule Take 50,000 Units by mouth every Wednesday.   No facility-administered encounter medications on file as of 03/10/2024.    No past medical history on file.  Past Surgical History:  Procedure Laterality Date   ARTHROSCOPY WITH ANTERIOR CRUCIATE LIGAMENT (ACL) REPAIR WITH ANTERIOR TIBILIAS GRAFT Right    c section     CHOLECYSTECTOMY N/A 11/09/2023   Procedure: LAPAROSCOPIC CHOLECYSTECTOMY WITH ICG;  Surgeon: Vanderbilt Ned, MD;  Location: [facility];  Service: General;  Laterality: N/A;  ICG   ERCP N/A 11/08/2023   Procedure: ERCP, WITH INTERVENTION IF INDICATED;  Surgeon: Rollin Dover, MD;  Location: [facility];  Service: Gastroenterology;  Laterality: N/A;   SPHINCTEROTOMY  11/08/2023   Procedure: SPHINCTEROTOMY,  BILIARY;  Surgeon: Rollin Dover, MD;  Location: [facility];  Service: Gastroenterology;;   STONE EXTRACTION WITH BASKET  11/08/2023   Procedure: ERCP, WITH REMOVAL OF COMMON BILE DUCT CALCULUS USING BALLOON;  Surgeon: Rollin Dover, MD;  Location: [facility];  Service: Gastroenterology;;   TUBAL LIGATION     WISDOM TOOTH EXTRACTION      No family history on file.  Social History   Socioeconomic History   Marital status: Divorced    Spouse name: Not on file   Number of children: Not on file   Years of education: Not on file   Highest education level: Not on file  Occupational History   Not on file  Tobacco Use   Smoking status: Former    Current packs/day: 1.00    Types: Cigarettes   Smokeless tobacco: Never  Vaping Use   Vaping status: Every Day  Substance and Sexual Activity   Alcohol use: Not Currently   Drug use: Never   Sexual activity: Yes    Partners: Male    Birth control/protection: Surgical  Other Topics Concern   Not on file  Social History Narrative   Not on file   Social Drivers of Health   Financial Resource Strain: Not on file  Food Insecurity: No Food Insecurity (11/06/2023)   Hunger Vital Sign    Worried About Running Out of Food in the Last Year: Never true    Ran Out  of Food in the Last Year: Never true  Transportation Needs: No Transportation Needs (11/06/2023)   PRAPARE - Administrator, Civil Service (Medical): No    Lack of Transportation (Non-Medical): No  Physical Activity: Not on file  Stress: Not on file  Social Connections: Socially Isolated (11/06/2023)   Social Connection and Isolation Panel    Frequency of Communication with Friends and Family: More than three times a week    Frequency of Social Gatherings with Friends and Family: More than three times a week    Attends Religious Services: Never    Database administrator or Organizations: No    Attends Banker Meetings: Never    Marital Status: Divorced   Catering manager Violence: Not At Risk (11/06/2023)   Humiliation, Afraid, Rape, and Kick questionnaire    Fear of Current or Ex-Partner: No    Emotionally Abused: No    Physically Abused: No    Sexually Abused: No       Objective:    There were no vitals taken for this visit.   Physical Exam Vitals and nursing note reviewed.  Constitutional:      General: She is not in acute distress.    Appearance: Normal appearance. She is normal weight.  HENT:     Head: Normocephalic and atraumatic.     Right Ear: External ear normal.     Left Ear: External ear normal.     Nose: Nose normal.     Mouth/Throat:     Mouth: Mucous membranes are moist.     Pharynx: Oropharynx is clear.  Eyes:     Extraocular Movements: Extraocular movements intact.     Pupils: Pupils are equal, round, and reactive to light.  Cardiovascular:     Rate and Rhythm: Normal rate and regular rhythm.     Pulses: Normal pulses.     Heart sounds: Normal heart sounds.  Pulmonary:     Effort: Pulmonary effort is normal. No respiratory distress.     Breath sounds: Normal breath sounds. No wheezing, rhonchi or rales.  Musculoskeletal:        General: Normal range of motion.     Cervical back: Normal range of motion.     Right lower leg: No edema.     Left lower leg: No edema.  Lymphadenopathy:     Cervical: No cervical adenopathy.  Neurological:     General: No focal deficit present.     Mental Status: She is alert and oriented to person, place, and time.  Psychiatric:        Mood and Affect: Mood normal.        Thought Content: Thought content normal.     No results found for any visits on 03/10/24.      Assessment & Plan:   Assessment and Plan Assessment & Plan      No orders of the defined types were placed in this encounter.    No orders of the defined types were placed in this encounter.   No follow-ups on file.  Corean LITTIE Ku, FNP

## 2024-03-10 NOTE — Progress Notes (Signed)
 New Patient Office Visit  Subjective    Patient ID: Olivia James, female    DOB: 09-04-1975  Age: 48 y.o. MRN: [id number]  CC: No chief complaint on file.   HPI Olivia James presents to establish care. She reports that she feels well overall. Patient states she was recently hospitalized and had a cholecystectomy in March 2025 but feels she has healed with no lingering concerns. She states that her past medical history is positive for migraines, menorrhagia, anemia, and vitamin D deficiency. Currently, she is only taking tranexamic acid  for menorrhagia.   Outpatient Encounter Medications as of 03/10/2024  Medication Sig   oxyCODONE  (OXY IR/ROXICODONE ) 5 MG immediate release tablet Take 1 tablet (5 mg total) by mouth every 6 (six) hours as needed for moderate pain (pain score 4-6).   SUMAtriptan (IMITREX) 25 MG tablet Take 1 tablet (25 mg total) by mouth every 2 (two) hours as needed for migraine. May repeat in 2 hours if headache persists or recurs.   topiramate (TOPAMAX) 25 MG tablet Take 1 tablet (25 mg total) by mouth at bedtime.   tranexamic acid  (LYSTEDA ) 650 MG TABS tablet TAKE 2 TABLETS(1300 MG) BY MOUTH THREE TIMES DAILY DURING MENSES. MAXIMUM OF 5 DAYS   Vitamin D, Ergocalciferol, (DRISDOL) 1.25 MG (50000 UNIT) CAPS capsule Take 50,000 Units by mouth every Wednesday.   [DISCONTINUED] topiramate (TOPAMAX) 50 MG tablet Take 50 mg by mouth at bedtime.   ACCRUFER 30 MG CAPS Take 30 mg by mouth 2 (two) times daily. (Patient not taking: Reported on 03/10/2024)   ondansetron  (ZOFRAN ) 4 MG tablet Take 1 tablet (4 mg total) by mouth every 8 (eight) hours as needed for nausea or vomiting. (Patient not taking: Reported on 03/10/2024)   [DISCONTINUED] NURTEC 75 MG TBDP Take 75 mg by mouth daily as needed (for migraine- dissolve orally- max of 75 mg/24 hours). (Patient not taking: Reported on 03/10/2024)   No facility-administered encounter medications on file as of 03/10/2024.     Past Medical History:  Diagnosis Date   Childhood asthma    Migraines     Past Surgical History:  Procedure Laterality Date   ARTHROSCOPY WITH ANTERIOR CRUCIATE LIGAMENT (ACL) REPAIR WITH ANTERIOR TIBILIAS GRAFT Right    c section     CHOLECYSTECTOMY N/A 11/09/2023   Procedure: LAPAROSCOPIC CHOLECYSTECTOMY WITH ICG;  Surgeon: Vanderbilt Ned, MD;  Location: [facility];  Service: General;  Laterality: N/A;  ICG   ERCP N/A 11/08/2023   Procedure: ERCP, WITH INTERVENTION IF INDICATED;  Surgeon: Rollin Dover, MD;  Location: [facility];  Service: Gastroenterology;  Laterality: N/A;   SPHINCTEROTOMY  11/08/2023   Procedure: SPHINCTEROTOMY, BILIARY;  Surgeon: Rollin Dover, MD;  Location: [facility];  Service: Gastroenterology;;   STONE EXTRACTION WITH BASKET  11/08/2023   Procedure: ERCP, WITH REMOVAL OF COMMON BILE DUCT CALCULUS USING BALLOON;  Surgeon: Rollin Dover, MD;  Location: [facility];  Service: Gastroenterology;;   TUBAL LIGATION     WISDOM TOOTH EXTRACTION      Social History   Socioeconomic History   Marital status: Divorced    Spouse name: Not on file   Number of children: Not on file   Years of education: Not on file   Highest education level: Not on file  Occupational History   Not on file  Tobacco Use   Smoking status: Former    Current packs/day: 1.00    Types: Cigarettes   Smokeless tobacco: Never  Vaping Use   Vaping  status: Every Day  Substance and Sexual Activity   Alcohol use: Not Currently   Drug use: Never   Sexual activity: Yes    Partners: Male    Birth control/protection: Surgical  Other Topics Concern   Not on file  Social History Narrative   Not on file   Social Drivers of Health   Financial Resource Strain: Not on file  Food Insecurity: No Food Insecurity (11/06/2023)   Hunger Vital Sign    Worried About Running Out of Food in the Last Year: Never true    Ran Out of Food in the Last Year: Never true  Transportation Needs: No  Transportation Needs (11/06/2023)   PRAPARE - Transportation    Lack of Transportation (Medical): No    Lack of Transportation (Non-Medical): No  Physical Activity: Not on file  Stress: Not on file  Social Connections: Socially Isolated (11/06/2023)   Social Connection and Isolation Panel    Frequency of Communication with Friends and Family: More than three times a week    Frequency of Social Gatherings with Friends and Family: More than three times a week    Attends Religious Services: Never    Database administrator or Organizations: No    Attends Banker Meetings: Never    Marital Status: Divorced  Catering manager Violence: Not At Risk (11/06/2023)   Humiliation, Afraid, Rape, and Kick questionnaire    Fear of Current or Ex-Partner: No    Emotionally Abused: No    Physically Abused: No    Sexually Abused: No    ROS See HPI     Objective    BP 110/70 (BP Location: Left Arm, Patient Position: Sitting)   Pulse 69   Temp 98.7 F (37.1 C) (Temporal)   Ht 5' 7 (1.702 m)   Wt 131 lb (59.4 kg)   SpO2 99%   BMI 20.52 kg/m   Physical Exam Vitals reviewed.  Constitutional:      Appearance: Normal appearance. She is not ill-appearing.  HENT:     Head: Normocephalic and atraumatic.     Right Ear: Tympanic membrane normal.     Left Ear: There is impacted cerumen.     Nose: Nose normal.     Mouth/Throat:     Mouth: Mucous membranes are moist.     Pharynx: Oropharynx is clear. No oropharyngeal exudate or posterior oropharyngeal erythema.  Eyes:     Extraocular Movements: Extraocular movements intact.     Pupils: Pupils are equal, round, and reactive to light.  Cardiovascular:     Rate and Rhythm: Normal rate and regular rhythm.  Pulmonary:     Effort: Pulmonary effort is normal.     Breath sounds: Normal breath sounds.  Abdominal:     General: Bowel sounds are normal.     Palpations: Abdomen is soft.  Musculoskeletal:        General: Normal range of  motion.     Cervical back: Normal range of motion.  Skin:    General: Skin is warm and dry.     Capillary Refill: Capillary refill takes less than 2 seconds.  Neurological:     Mental Status: She is alert and oriented to person, place, and time.  Psychiatric:        Mood and Affect: Mood normal.        Behavior: Behavior normal.        Assessment & Plan:   Problem List Items Addressed This Visit  Cardiovascular and Mediastinum   Migraine   Continue Topamax at bedtime. Imitrex as needed.       Relevant Medications   topiramate (TOPAMAX) 25 MG tablet   SUMAtriptan (IMITREX) 25 MG tablet     Other   Smoker - Primary   Relevant Orders   CBC w/Diff   Comp Met (CMET)   Lipid panel   Abnormal liver enzymes   Expect resolution s/p ERCP and cholecystectomy.      Relevant Orders   Comp Met (CMET)   Anemia   Relevant Orders   CBC w/Diff   Medication management   Relevant Medications   topiramate (TOPAMAX) 25 MG tablet   SUMAtriptan (IMITREX) 25 MG tablet   Other Relevant Orders   CBC w/Diff   Comp Met (CMET)   Lipid panel   Vitamin D 1,25 dihydroxy   Vitamin D deficiency   Relevant Orders   Vitamin D 1,25 dihydroxy   BMI 20.0-20.9, adult   Menorrhagia with regular cycle   Reports control with tranexamic acid . Continue same.        Return in about 4 weeks (around 04/07/2024) for meds recheck.   Debby CHRISTELLA Borer, RN

## 2024-03-10 NOTE — Assessment & Plan Note (Signed)
 Expect resolution s/p ERCP and cholecystectomy.

## 2024-03-14 LAB — VITAMIN D 1,25 DIHYDROXY
Vitamin D 1, 25 (OH)2 Total: 52 pg/mL (ref 18–72)
Vitamin D2 1, 25 (OH)2: 23 pg/mL
Vitamin D3 1, 25 (OH)2: 29 pg/mL

## 2024-03-14 LAB — LIPID PANEL
Cholesterol: 141 mg/dL (ref <200)
HDL: 54 mg/dL (ref >=50)
LDL Cholesterol (Calc): 71 mg/dL
Non-HDL Cholesterol (Calc): 87 mg/dL (ref <130)
Total CHOL/HDL Ratio: 2.6 (calc) (ref <5.0)
Triglycerides: 77 mg/dL (ref <150)

## 2024-05-31 ENCOUNTER — Other Ambulatory Visit: Payer: Self-pay | Admitting: Obstetrics and Gynecology

## 2024-05-31 DIAGNOSIS — N920 Excessive and frequent menstruation with regular cycle: Secondary | ICD-10-CM

## 2024-06-23 ENCOUNTER — Other Ambulatory Visit: Payer: Self-pay | Admitting: Family Medicine

## 2024-06-23 DIAGNOSIS — G43809 Other migraine, not intractable, without status migrainosus: Secondary | ICD-10-CM

## 2024-06-23 DIAGNOSIS — Z79899 Other long term (current) drug therapy: Secondary | ICD-10-CM
